# Patient Record
Sex: Female | Born: 1965 | Race: White | Hispanic: No | Smoking: Never smoker
Health system: Southern US, Community
[De-identification: ages and names within clinical notes are randomized; demographics above are authoritative.]

## PROBLEM LIST (undated history)

## (undated) DIAGNOSIS — E785 Hyperlipidemia, unspecified: Secondary | ICD-10-CM

## (undated) DIAGNOSIS — F419 Anxiety disorder, unspecified: Secondary | ICD-10-CM

## (undated) DIAGNOSIS — N926 Irregular menstruation, unspecified: Secondary | ICD-10-CM

## (undated) DIAGNOSIS — E559 Vitamin D deficiency, unspecified: Secondary | ICD-10-CM

## (undated) DIAGNOSIS — M1990 Unspecified osteoarthritis, unspecified site: Secondary | ICD-10-CM

## (undated) DIAGNOSIS — G501 Atypical facial pain: Secondary | ICD-10-CM

## (undated) DIAGNOSIS — R42 Dizziness and giddiness: Secondary | ICD-10-CM

## (undated) DIAGNOSIS — G479 Sleep disorder, unspecified: Secondary | ICD-10-CM

## (undated) DIAGNOSIS — M419 Scoliosis, unspecified: Secondary | ICD-10-CM

## (undated) HISTORY — DX: Sleep disorder, unspecified: G47.9

## (undated) HISTORY — PX: KNEE SURGERY: SHX244

## (undated) HISTORY — DX: Irregular menstruation, unspecified: N92.6

## (undated) HISTORY — DX: Hyperlipidemia, unspecified: E78.5

## (undated) HISTORY — DX: Atypical facial pain: G50.1

## (undated) HISTORY — DX: Vitamin D deficiency, unspecified: E55.9

---

## 1997-12-10 ENCOUNTER — Inpatient Hospital Stay (HOSPITAL_COMMUNITY): Admission: AD | Admit: 1997-12-10 | Discharge: 1997-12-10 | Payer: Self-pay | Admitting: Obstetrics and Gynecology

## 1998-03-15 ENCOUNTER — Inpatient Hospital Stay (HOSPITAL_COMMUNITY): Admission: AD | Admit: 1998-03-15 | Discharge: 1998-03-18 | Payer: Self-pay | Admitting: Obstetrics and Gynecology

## 1998-04-17 ENCOUNTER — Other Ambulatory Visit: Admission: RE | Admit: 1998-04-17 | Discharge: 1998-04-17 | Payer: Self-pay | Admitting: *Deleted

## 1999-05-09 ENCOUNTER — Other Ambulatory Visit: Admission: RE | Admit: 1999-05-09 | Discharge: 1999-05-09 | Payer: Self-pay | Admitting: Obstetrics and Gynecology

## 2000-05-25 ENCOUNTER — Other Ambulatory Visit: Admission: RE | Admit: 2000-05-25 | Discharge: 2000-05-25 | Payer: Self-pay | Admitting: Obstetrics and Gynecology

## 2006-03-31 ENCOUNTER — Ambulatory Visit: Payer: Self-pay | Admitting: Internal Medicine

## 2007-11-12 ENCOUNTER — Ambulatory Visit: Payer: Self-pay | Admitting: Internal Medicine

## 2008-03-01 ENCOUNTER — Ambulatory Visit: Payer: Self-pay

## 2009-01-31 ENCOUNTER — Ambulatory Visit: Payer: Self-pay | Admitting: Surgery

## 2009-02-05 ENCOUNTER — Ambulatory Visit: Payer: Self-pay | Admitting: Surgery

## 2009-03-09 ENCOUNTER — Ambulatory Visit: Payer: Self-pay

## 2010-04-09 ENCOUNTER — Ambulatory Visit: Payer: Self-pay | Admitting: Unknown Physician Specialty

## 2010-08-28 ENCOUNTER — Ambulatory Visit: Payer: Self-pay | Admitting: Obstetrics and Gynecology

## 2011-05-14 ENCOUNTER — Ambulatory Visit: Payer: Self-pay

## 2011-12-17 ENCOUNTER — Ambulatory Visit (INDEPENDENT_AMBULATORY_CARE_PROVIDER_SITE_OTHER): Payer: BC Managed Care – PPO | Admitting: Sports Medicine

## 2011-12-17 VITALS — BP 120/70 | Ht 64.0 in | Wt 180.0 lb

## 2011-12-17 DIAGNOSIS — M25569 Pain in unspecified knee: Secondary | ICD-10-CM

## 2011-12-17 DIAGNOSIS — M25562 Pain in left knee: Secondary | ICD-10-CM

## 2011-12-17 NOTE — Assessment & Plan Note (Addendum)
Pt most likely having knee pain r/t deconditioning of muscles- which has been worsened by pt's fear of using left knee.  Pt given quad/knee strengthening exercises as well as balance exercises. Pt also to wear body helix knee brace for support.  See pt handout.  Pt to return in 6 weeks for f/up.

## 2011-12-17 NOTE — Patient Instructions (Addendum)
Do exercises on Handout that are circled. Do stair step- start with 4 inches and move up to 8 inch step.  Do front stepup, lateral step, crossover stepup Balance exercises- cone touch-- start high with touching table then work towards touching small cone-- do 5 then work up to 10x  Single leg balance- stand on 1 leg and close eyes- work up to 10 x  And holding for 10 seconds  Return in 6 weeks for recheck.

## 2011-12-17 NOTE — Progress Notes (Signed)
  Subjective:    Patient ID: Angela Wu, female    DOB: 1966/03/19, 46 y.o.   MRN: [id number]  HPI Left knee pain: Pt here for initial evaluation for left knee pain.  Pt had left patellar fracture in 2011- was surgically corrected and immobilized x 14 weeks- had long duration of PT with significant post op discomfort.  Since that time has continued to have pain in left knee that has caused her to be unable to exercise without pain, uncomfortable and scared to walk up and down steps afraid that her left knee will give out of her.  Left knee will feel unsteady or occasionally just "give out"  if stepping off curb, on uneven surface, or on slick surface.  Unable to wear any shoe with a heel 2/2 to knee discomfort.  Here today for evaluation and treatment for pain and would like to approve functionality of left knee so that she can do exercise, use stairs, walk on uneven surface without problem.  No knee swelling. No knee redness. No fever.    Review of Systems As per above.     Objective:   Physical Exam  Constitutional: She appears well-developed.  Cardiovascular: Normal rate.   Pulmonary/Chest: Effort normal. No respiratory distress.  Musculoskeletal:       Left knee: Midline scar present over knee.  No knee redness. No knee effusion.  Decreased sensation around scar otherwise normal.  + Full flexion unable to fully extend left knee 2/2 pain and tightness in left knee- lacks 10 degrees in extension. Passively able to extend completely.  Ligaments of knee with normal end points.  Some + crepitus in left knee with movement             Assessment & Plan:

## 2012-01-28 ENCOUNTER — Ambulatory Visit (INDEPENDENT_AMBULATORY_CARE_PROVIDER_SITE_OTHER): Payer: BC Managed Care – PPO | Admitting: Sports Medicine

## 2012-01-28 VITALS — BP 100/67

## 2012-01-28 DIAGNOSIS — M25562 Pain in left knee: Secondary | ICD-10-CM

## 2012-01-28 DIAGNOSIS — M25569 Pain in unspecified knee: Secondary | ICD-10-CM

## 2012-01-28 NOTE — Progress Notes (Signed)
  Subjective:    Patient ID: Angela Wu, female    DOB: [birth year], 46 y.o.   MRN: [id number]  HPI Pt that comes today for f/u left knee pain  Hx: Left patellar fracture in 2011 s/p surgically corrected and immobilized for 14 weeks and post op PT.  The pain in left knee has improved 40% since last visit with knee exercises, which she reports compliance, and occasional Ibuprofen. She is afraid to walk up and down steps because pain and/or fear that left knee may give out. Squatting and sitting on the floor are the most painful postures she recalls. No knee redness. No fever.   Review of Systems per HPI   Objective:   Physical Exam Musculoskeletal:  Left knee:Midline scar over knee.No knee effusion or erythema. Normal ROM active and passive. ( Able to completely flex and extend knee). Positive crepitus over knee cap with flexion/extension. No joint laxity. Moderated tenderness over lateral side of joint line.   Stable ligaments Some weakness of hip abduction Atrophy of the VMO     Assessment & Plan:

## 2012-01-28 NOTE — Assessment & Plan Note (Signed)
She has made a lot of progress in the motion of her knee  We will keep up rehabilitation exercises to strengthen the hip and quadriceps  Continue to work on full extension  Used a compression sleeve  Recheck with [place] in 2-3 months

## 2012-03-24 ENCOUNTER — Ambulatory Visit (INDEPENDENT_AMBULATORY_CARE_PROVIDER_SITE_OTHER): Payer: BC Managed Care – PPO | Admitting: Sports Medicine

## 2012-03-24 VITALS — BP 121/80 | Ht 64.0 in | Wt 170.0 lb

## 2012-03-24 DIAGNOSIS — M25569 Pain in unspecified knee: Secondary | ICD-10-CM

## 2012-03-24 DIAGNOSIS — M25562 Pain in left knee: Secondary | ICD-10-CM

## 2012-03-24 NOTE — Patient Instructions (Signed)
Good to see you. You are doing great! Keep going to the gym and work on full extension of the knee.  Work on hip strength and core strength.  Try the sitting elliptical.  Come back as needed.

## 2012-03-24 NOTE — Progress Notes (Signed)
  Subjective:    Patient ID: Angela Wu, female    DOB: 08-Mar-1966, 46 y.o.   MRN: [id number]  HPI Pt that comes today for f/u left knee pain  Patient states the pain has improved again. Patient has been doing the rehabilitation exercises as well as wearing the compression sleeve as needed. Patient is focused on trying to get full extension of the knee now. Patient also states she's been trying to strengthen the hip and quadricep. Patient has joined a gym which has been very beneficial in this started doing the exercises with weights. Patient has also lost weight which she states has helped. Patient has not been wearing the knee compression sleeve as much, and only uses ibuprofen once or twice a week.  Hx: Left patellar fracture in 2011 s/p surgically corrected and immobilized for 14 weeks and post op PT.    Review of Systems per HPI   Objective:   Physical Exam Filed Vitals:   03/24/12 0841  BP: 121/80   general: No apparent distress alert and oriented x3  Musculoskeletal:  Left knee:Midline scar over knee.No knee effusion or erythema. Normal ROM active and passive. ( Able to completely flex and extend knee). Minimal crepitus over knee cap with flexion/extension. No joint laxity.  Stable ligaments Some weakness of hip abduction patient has improved strength from previous exam now for 4/5. Atrophy of the VMO is still present but has improved    Assessment & Plan:

## 2012-03-24 NOTE — Assessment & Plan Note (Signed)
Patient has improved significantly pronounced 2 years status post patellar fracture ORIF. Patient seems to be having full range of motion in pain is minimal. Encourage patient to continue strengthening focusing mostly on the quad as well as the hip abductor strength. Patient encouraged to continue with weight loss as well which will be beneficial. Patient can followup as needed at this time. If she has any setbacks or any worsening of pain would be very happy to help.

## 2013-05-06 ENCOUNTER — Ambulatory Visit: Payer: Self-pay | Admitting: Obstetrics and Gynecology

## 2014-06-19 ENCOUNTER — Ambulatory Visit: Payer: Self-pay | Admitting: Obstetrics and Gynecology

## 2014-08-07 ENCOUNTER — Emergency Department: Payer: Self-pay | Admitting: Emergency Medicine

## 2014-08-11 ENCOUNTER — Emergency Department: Payer: Self-pay | Admitting: Emergency Medicine

## 2014-11-24 ENCOUNTER — Ambulatory Visit: Payer: No Typology Code available for payment source | Attending: Otolaryngology

## 2014-11-24 DIAGNOSIS — R0683 Snoring: Secondary | ICD-10-CM | POA: Diagnosis not present

## 2014-11-24 DIAGNOSIS — G4710 Hypersomnia, unspecified: Secondary | ICD-10-CM | POA: Diagnosis present

## 2014-12-28 ENCOUNTER — Telehealth: Payer: Self-pay | Admitting: Obstetrics and Gynecology

## 2014-12-28 DIAGNOSIS — Z3040 Encounter for surveillance of contraceptives, unspecified: Secondary | ICD-10-CM

## 2014-12-28 DIAGNOSIS — F339 Major depressive disorder, recurrent, unspecified: Secondary | ICD-10-CM

## 2014-12-28 MED ORDER — NORETHIN ACE-ETH ESTRAD-FE 1.5-30 MG-MCG PO TABS: 1.0000 | ORAL_TABLET | Freq: Every day | ORAL | Status: DC

## 2014-12-28 MED ORDER — SERTRALINE HCL 25 MG PO TABS: 50.0000 mg | ORAL_TABLET | Freq: Every day | ORAL | Status: DC

## 2014-12-28 NOTE — Telephone Encounter (Signed)
WE RESCHEDULED HER AE PER DENISE TO 7/26. SHE WILL NEED HER BC REFILLED AND ZOLOFT. SHE SAID AT LEAST 1 MONTH UNTIL SHE IS SEEN. [place] U

## 2014-12-28 NOTE — Telephone Encounter (Signed)
Refilled pts zoloft and junel fe x 1 until annual appt, was rescheduled due to new computer system

## 2015-01-25 ENCOUNTER — Encounter: Payer: Self-pay | Admitting: *Deleted

## 2015-01-30 ENCOUNTER — Encounter: Payer: Self-pay | Admitting: Obstetrics and Gynecology

## 2015-01-30 ENCOUNTER — Ambulatory Visit (INDEPENDENT_AMBULATORY_CARE_PROVIDER_SITE_OTHER): Payer: 59 | Admitting: Obstetrics and Gynecology

## 2015-01-30 VITALS — BP 112/82 | HR 86 | Ht 64.5 in | Wt 203.2 lb

## 2015-01-30 DIAGNOSIS — Z01419 Encounter for gynecological examination (general) (routine) without abnormal findings: Secondary | ICD-10-CM

## 2015-01-30 NOTE — Progress Notes (Signed)
  Subjective:     Angela Wu is a 49 y.o. female and is here for a comprehensive physical exam. The patient reports no problems.  History   Social History  . Marital Status: Married    Spouse Name: N/A  . Number of Children: N/A  . Years of Education: N/A   Occupational History  . Not on file.   Social History Main Topics  . Smoking status: Never Smoker   . Smokeless tobacco: Never Used  . Alcohol Use: Yes  . Drug Use: No  . Sexual Activity: No   Other Topics Concern  . Not on file   Social History Narrative   Health Maintenance  Topic Date Due  . HIV Screening  12/20/1982  . PAP SMEAR  12/19/1985  . TETANUS/TDAP  12/20/1986  . INFLUENZA VACCINE  02/05/2015    The following portions of the patient's history were reviewed and updated as appropriate: allergies, current medications, past family history, past medical history, past social history, past surgical history and problem list.  Review of Systems A comprehensive review of systems was negative.   Objective:    General appearance: alert, cooperative, appears stated age and mildly obese Throat: lips, mucosa, and tongue normal; teeth and gums normal Lungs: clear to auscultation bilaterally Breasts: normal appearance, no masses or tenderness Heart: regular rate and rhythm, S1, S2 normal, no murmur, click, rub or gallop Abdomen: soft, non-tender; bowel sounds normal; no masses,  no organomegaly Pelvic: cervix normal in appearance, external genitalia normal, no adnexal masses or tenderness, no cervical motion tenderness, rectovaginal septum normal, uterus normal size, shape, and consistency and vagina normal without discharge    Assessment:    Healthy female exam. OCP user for cycle control- occasional BTB; Obesity      Plan:  Pap not indicated Routine screening MMG done  OCPs refilled RTC 1 year or PRN See After Visit Summary for Counseling Recommendations   Melody Trudee Kuster, CNM

## 2015-01-30 NOTE — Patient Instructions (Signed)
Thank you for enrolling in [facility]. Please follow the instructions below to securely access your online medical record. MyChart allows you to send messages to your doctor, view your test results, renew your prescriptions, schedule appointments, and more.  How Do I Sign Up? 1. In your Internet browser, go to [email or web address]. 2. Click on the New  User? link in the Sign In box.  3. Enter your MyChart Access Code exactly as it appears below. You will not need to use this code after you have completed the sign-up process. If you do not sign up before the expiration date, you must request a new code. MyChart Access Code: [email or web address] Expires: 03/31/2015  2:04 PM  4. Enter the last four digits of your Social Security Number (xxxx) and Date of Birth (mm/dd/yyyy) as indicated and click Next. You will be taken to the next sign-up page. 5. Create a MyChart ID. This will be your MyChart login ID and cannot be changed, so think of one that is secure and easy to remember. 6. Create a MyChart password. You can change your password at any time. 7. Enter your Password Reset Question and Answer and click Next. This can be used at a later time if you forget your password.  8. Select your communication preference, and if applicable enter your e-mail address. You will receive e-mail notification when new information is available in MyChart by choosing to receive e-mail notifications and filling in your e-mail. 9. Click Sign In. You can now view your medical record.   Additional Information If you have questions, you can email [email or web address] or call [phone] to talk to our [place] staff. Remember, MyChart is NOT to be used for urgent needs. For medical emergencies, dial 911.

## 2015-02-07 ENCOUNTER — Telehealth: Payer: Self-pay | Admitting: Obstetrics and Gynecology

## 2015-02-07 NOTE — Telephone Encounter (Signed)
Refill not at [facility] / bladder issues, can't all urine out only at night and she can't sleep.

## 2015-02-08 ENCOUNTER — Other Ambulatory Visit: Payer: Self-pay | Admitting: Obstetrics and Gynecology

## 2015-02-08 NOTE — Telephone Encounter (Signed)
Hey Mel do you know anything about this???

## 2015-02-13 NOTE — Telephone Encounter (Signed)
Please call to clarify.

## 2015-02-19 NOTE — Telephone Encounter (Signed)
i have tried to contact pt several times na MNB has tried to call her will send letter to contact if needs [place]

## 2015-05-18 ENCOUNTER — Telehealth: Payer: Self-pay | Admitting: Obstetrics and Gynecology

## 2015-05-18 NOTE — Telephone Encounter (Signed)
Patient called requesting a refill on sertraline(Zoloft) sent to the [facility]. She is almost out. Thanks

## 2015-05-18 NOTE — Telephone Encounter (Signed)
Done-ac 

## 2015-08-07 ENCOUNTER — Telehealth: Payer: Self-pay | Admitting: Obstetrics and Gynecology

## 2015-08-07 NOTE — Telephone Encounter (Signed)
Called pt she is having some sx where she is getting up and urinating about 5 x a night,advised pt she needs appt with [facility]- she voiced understanding

## 2015-08-07 NOTE — Telephone Encounter (Signed)
Pt left msg on answer machine that she had an issue and wanted to speak to a nurse

## 2015-08-16 ENCOUNTER — Ambulatory Visit: Payer: Self-pay | Admitting: Obstetrics and Gynecology

## 2015-09-05 ENCOUNTER — Ambulatory Visit: Payer: Self-pay | Admitting: Obstetrics and Gynecology

## 2016-01-06 ENCOUNTER — Emergency Department: Payer: BLUE CROSS/BLUE SHIELD

## 2016-01-06 ENCOUNTER — Emergency Department
Admission: EM | Admit: 2016-01-06 | Discharge: 2016-01-06 | Disposition: A | Discharge status: Home / Self Care | Payer: BLUE CROSS/BLUE SHIELD | Attending: Emergency Medicine | Admitting: Emergency Medicine

## 2016-01-06 ENCOUNTER — Encounter: Payer: Self-pay | Admitting: Emergency Medicine

## 2016-01-06 DIAGNOSIS — Y9289 Other specified places as the place of occurrence of the external cause: Secondary | ICD-10-CM | POA: Diagnosis not present

## 2016-01-06 DIAGNOSIS — E785 Hyperlipidemia, unspecified: Secondary | ICD-10-CM | POA: Diagnosis not present

## 2016-01-06 DIAGNOSIS — W172XXA Fall into hole, initial encounter: Secondary | ICD-10-CM | POA: Diagnosis not present

## 2016-01-06 DIAGNOSIS — S8392XA Sprain of unspecified site of left knee, initial encounter: Secondary | ICD-10-CM

## 2016-01-06 DIAGNOSIS — S93401A Sprain of unspecified ligament of right ankle, initial encounter: Secondary | ICD-10-CM | POA: Insufficient documentation

## 2016-01-06 DIAGNOSIS — Y939 Activity, unspecified: Secondary | ICD-10-CM | POA: Insufficient documentation

## 2016-01-06 DIAGNOSIS — Y999 Unspecified external cause status: Secondary | ICD-10-CM | POA: Insufficient documentation

## 2016-01-06 DIAGNOSIS — Z79899 Other long term (current) drug therapy: Secondary | ICD-10-CM | POA: Insufficient documentation

## 2016-01-06 DIAGNOSIS — G8911 Acute pain due to trauma: Secondary | ICD-10-CM

## 2016-01-06 DIAGNOSIS — M25571 Pain in right ankle and joints of right foot: Secondary | ICD-10-CM | POA: Diagnosis present

## 2016-01-06 HISTORY — DX: Anxiety disorder, unspecified: F41.9

## 2016-01-06 MED ORDER — OXYCODONE HCL 5 MG PO TABS
5.0000 mg | ORAL_TABLET | Freq: Once | ORAL | Status: AC
  Administered 2016-01-06: 5 mg via ORAL
  Filled 2016-01-06: qty 1

## 2016-01-06 MED ORDER — IBUPROFEN 800 MG PO TABS: 800.0000 mg | ORAL_TABLET | Freq: Three times a day (TID) | ORAL | Status: DC | PRN

## 2016-01-06 MED ORDER — OXYCODONE-ACETAMINOPHEN 5-325 MG PO TABS: 1.0000 | ORAL_TABLET | Freq: Four times a day (QID) | ORAL | Status: DC | PRN

## 2016-01-06 NOTE — ED Notes (Signed)
[place] c/o pain to bilateral lower extremities; no bruising noted; mild swelling to right foot;

## 2016-01-06 NOTE — ED Provider Notes (Signed)
[facility] Emergency Department Provider Note ____________________________________________  Time seen: Approximately 9:42 PM  I have reviewed the triage vital signs and the nursing notes.   HISTORY  Chief Complaint Fall    HPI Angela Wu is a 50 y.o. female who presents to the emergency department for evaluation of bilateral lower extremity pain after falling at approximately 5:19PM. She states that she stepped in a hole with her right foot, fell on her left knee, and then when trying to stand back up twisted her left ankle and fell onto the right knee.She took 800 mg of ibuprofen prior to arrival.  Past Medical History  Diagnosis Date  . Hyperlipidemia   . Irregular bleeding   . Sleep disturbance   . Vitamin D deficiency   . Anxiety     Patient Active Problem List   Diagnosis Date Noted  . Left knee pain 12/17/2011    Past Surgical History  Procedure Laterality Date  . Cesarean section      Current Outpatient Rx  Name  Route  Sig  Dispense  Refill  . ibuprofen (ADVIL,MOTRIN) 800 MG tablet   Oral   Take 1 tablet (800 mg total) by mouth every 8 (eight) hours as needed.   30 tablet   0   . norethindrone-ethinyl estradiol-iron (MICROGESTIN FE 1.5/30) 1.5-30 MG-MCG tablet   Oral   Take 1 tablet by mouth daily.   1 Package   1   . oxyCODONE-acetaminophen (ROXICET) 5-325 MG tablet   Oral   Take 1 tablet by mouth every 6 (six) hours as needed.   12 tablet   0   . sertraline (ZOLOFT) 25 MG tablet   Oral   Take 2 tablets (50 mg total) by mouth daily.   60 tablet   1     Allergies Review of patient's allergies indicates no known allergies.  Family History  Problem Relation Age of Onset  . Heart disease Father     Social History Social History  Substance Use Topics  . Smoking status: Never Smoker   . Smokeless tobacco: Never Used  . Alcohol Use: Yes    Review of Systems Constitutional: No recent illness. Cardiovascular:  Denies chest pain or palpitations. Respiratory: Denies shortness of breath. Musculoskeletal: Pain in Bilateral lower extremities. Skin: Negative for rash, wound, lesion. Neurological: Negative for focal weakness or numbness.  ____________________________________________   PHYSICAL EXAM:  VITAL SIGNS: ED Triage Vitals  Enc Vitals Group     BP 01/06/16 1907 120/79 mmHg     Pulse Rate 01/06/16 1907 89     Resp 01/06/16 1907 18     Temp 01/06/16 1907 97.6 F (36.4 C)     Temp Source 01/06/16 1907 Oral     SpO2 01/06/16 1907 100 %     Weight 01/06/16 1907 175 lb (79.379 kg)     Height 01/06/16 1907 5\' 5"  (1.651 m)     Head Cir --      Peak Flow --      Pain Score 01/06/16 1908 8     Pain Loc --      Pain Edu? --      Excl. in [place]? --     Constitutional: Alert and oriented. Well appearing and in no acute distress. Eyes: Conjunctivae are normal. EOMI. Head: Atraumatic. Neck: No stridor.  Respiratory: Normal respiratory effort.   Musculoskeletal: Left knee mildly edematous without lesion or wound. Left ankle mildly edematous in the ATFL pattern. Tenderness over  the right distal femur/proximal right patella without edema or deformity. Tenderness over the length of the tibia and fibula of the right lower extremity extending down to the ankle. There is no deformity of the ankle, there is mild edema in the ATFL pattern as well as tenderness in the upper portion of the foot. Neurologic:  Normal speech and language. No gross focal neurologic deficits are appreciated. Speech is normal. No gait instability. Skin:  Skin is warm, dry and intact. Atraumatic. Psychiatric: Mood and affect are normal. Speech and behavior are normal.  ____________________________________________   LABS (all labs ordered are listed, but only abnormal results are displayed)  Labs Reviewed - No data to display ____________________________________________  RADIOLOGY  Left knee, left ankle, right femur, and  right tib-fib all negative for acute bony abnormality per radiology. ____________________________________________   PROCEDURES  Procedure(s) performed:  Ace bandage was applied to the left knee by ER tech. Velcro ankle stirrup splint was applied to the right ankle by ER tech. Patient was neurovascularly intact post-splint application.   ____________________________________________   INITIAL IMPRESSION / ASSESSMENT AND PLAN / ED COURSE  Pertinent labs & imaging results that were available during my care of the patient were reviewed by me and considered in my medical decision making (see chart for details).  Patient was instructed to schedule a follow-up appointment with Reche Dixon, PA-C who is her orthopedist. She was advised to rest, ice, and elevate the lower extremities as much as possible over the next few days. She is advised to return to the emergency department for symptoms that change or worsen if she's unable to schedule an appointment. She will be prescribed Roxicet 5-325, 12 tablets and ibuprofen 800 mg #30. ____________________________________________   FINAL CLINICAL IMPRESSION(S) / ED DIAGNOSES  Final diagnoses:  Acute pain due to injury  Left knee sprain, initial encounter  Right ankle sprain, initial encounter       Victorino Dike, FNP 01/06/16 2228  Delman Kitten, MD 01/06/16 2354

## 2016-01-31 ENCOUNTER — Encounter: Payer: 59 | Admitting: Obstetrics and Gynecology

## 2016-02-04 ENCOUNTER — Encounter: Payer: Self-pay | Admitting: Obstetrics and Gynecology

## 2016-02-28 ENCOUNTER — Telehealth: Payer: Self-pay | Admitting: Obstetrics and Gynecology

## 2016-02-28 NOTE — Telephone Encounter (Signed)
WE HAD TO CX PTS AE ON 03/13/16 DUE TO [facility] ON VACATION, SHE IS RESCHEDULED TO 11/7 FOR AE NOW, THAT IS WHEN THE AE SLOT WAS FOR [facility] SCHEDULE, SHE IS GOING TO NEED REFILLS ON HER ZOLOFT AND BC, SHE USES [facility].

## 2016-03-03 ENCOUNTER — Other Ambulatory Visit: Payer: Self-pay | Admitting: *Deleted

## 2016-03-03 DIAGNOSIS — F339 Major depressive disorder, recurrent, unspecified: Secondary | ICD-10-CM

## 2016-03-03 DIAGNOSIS — Z3040 Encounter for surveillance of contraceptives, unspecified: Secondary | ICD-10-CM

## 2016-03-03 MED ORDER — SERTRALINE HCL 25 MG PO TABS: 50.0000 mg | ORAL_TABLET | Freq: Every day | ORAL | 3 refills | Status: DC

## 2016-03-03 MED ORDER — NORETHIN ACE-ETH ESTRAD-FE 1.5-30 MG-MCG PO TABS: 1.0000 | ORAL_TABLET | Freq: Every day | ORAL | 3 refills | Status: DC

## 2016-03-03 NOTE — Telephone Encounter (Signed)
rx refilled for pt

## 2016-03-13 ENCOUNTER — Encounter: Payer: Self-pay | Admitting: Obstetrics and Gynecology

## 2016-05-13 ENCOUNTER — Encounter: Payer: Self-pay | Admitting: Obstetrics and Gynecology

## 2016-05-13 ENCOUNTER — Other Ambulatory Visit: Payer: Self-pay | Admitting: Obstetrics and Gynecology

## 2016-05-13 ENCOUNTER — Ambulatory Visit (INDEPENDENT_AMBULATORY_CARE_PROVIDER_SITE_OTHER): Payer: BLUE CROSS/BLUE SHIELD | Admitting: Obstetrics and Gynecology

## 2016-05-13 VITALS — BP 124/80 | HR 87 | Ht 64.5 in | Wt 194.7 lb

## 2016-05-13 DIAGNOSIS — Z01419 Encounter for gynecological examination (general) (routine) without abnormal findings: Secondary | ICD-10-CM

## 2016-05-13 DIAGNOSIS — F339 Major depressive disorder, recurrent, unspecified: Secondary | ICD-10-CM

## 2016-05-13 DIAGNOSIS — Z3041 Encounter for surveillance of contraceptive pills: Secondary | ICD-10-CM

## 2016-05-13 MED ORDER — SERTRALINE HCL 25 MG PO TABS: 50.0000 mg | ORAL_TABLET | Freq: Every day | ORAL | 3 refills | Status: DC

## 2016-05-13 MED ORDER — NORETHIN ACE-ETH ESTRAD-FE 1.5-30 MG-MCG PO TABS: 1.0000 | ORAL_TABLET | Freq: Every day | ORAL | 3 refills | Status: DC

## 2016-05-13 NOTE — Progress Notes (Signed)
Subjective:   Angela Wu is a 50 y.o. G36P0 Caucasian female here for a routine well-woman exam.  Patient's last menstrual period was 05/05/2016.    Current complaints: Feels that she has to empty her bladder completely before she can fall asleep. Also reports drinking 2 32oz cups of water prior to bedtime. OCP user, states periods are light and only has breakthrough bleeding when she takes her pill later. PCP: None       Does desire labs Does not want flu shot. TDAP UTD per patient Mother diagnosed in February 2017 with ovarian cancer and had total hysterectomy in April 2017.   Social History: Sexual: heterosexual Marital Status: divorced Living situation: with family Occupation: Works at her own [employer]. Tobacco/alcohol: very infrequent ETOH use, caffeine use occasional Illicit drugs: no history of illicit drug use  The following portions of the patient's history were reviewed and updated as appropriate: allergies, current medications, past family history, past medical history, past social history, past surgical history and problem list.  Past Medical History Past Medical History:  Diagnosis Date  . Anxiety   . Hyperlipidemia   . Irregular bleeding   . Sleep disturbance   . Vitamin D deficiency     Past Surgical History Past Surgical History:  Procedure Laterality Date  . CESAREAN SECTION      Gynecologic History G2P0  Patient's last menstrual period was 05/05/2016. Contraception: OCP (estrogen/progesterone) Last Pap: 2015. Results were: normal Last mammogram: 2016. Results were: normal   Obstetric History OB History  Gravida Para Term Preterm AB Living  2            SAB TAB Ectopic Multiple Live Births               # Outcome Date GA Lbr Len/2nd Weight Sex Delivery Anes PTL Lv  2 Gravida 2002    M VBAC     1 Gravida 1999    F CS-Unspec         Current Medications Current Outpatient Prescriptions on File Prior to Visit  Medication Sig Dispense  Refill  . ibuprofen (ADVIL,MOTRIN) 800 MG tablet Take 1 tablet (800 mg total) by mouth every 8 (eight) hours as needed. 30 tablet 0  . norethindrone-ethinyl estradiol-iron (MICROGESTIN FE 1.5/30) 1.5-30 MG-MCG tablet Take 1 tablet by mouth daily. 1 Package 3  . oxyCODONE-acetaminophen (ROXICET) 5-325 MG tablet Take 1 tablet by mouth every 6 (six) hours as needed. 12 tablet 0  . sertraline (ZOLOFT) 25 MG tablet Take 2 tablets (50 mg total) by mouth daily. 60 tablet 3   No current facility-administered medications on file prior to visit.     Review of Systems Patient denies any headaches, blurred vision, shortness of breath, chest pain, abdominal pain, problems with bowel movements, urination, or intercourse.   Objective:  BP 124/80   Pulse 87   Ht 5' 4.5" (1.638 m)   Wt 194 lb 11.2 oz (88.3 kg)   LMP 05/05/2016   BMI 32.90 kg/m  Physical Exam  General:  Well developed, well nourished, no acute distress. She is alert and oriented x3. Skin:  Warm and dry Neck:  Midline trachea, no thyromegaly or nodules Cardiovascular: Regular rate and rhythm, no murmur heard Lungs:  Effort normal, all lung fields clear to auscultation bilaterally Breasts:  No dominant palpable mass, retraction, or nipple discharge Abdomen:  Soft, non tender, no hepatosplenomegaly or masses Pelvic:  External genitalia is normal in appearance.  The vagina is normal in appearance.  The cervix is bulbous, no CMT.  Thin prep pap is done with HR HPV cotesting. Uterus is felt to be normal size, shape, and contour.  No adnexal masses or tenderness noted. Rectal: Good sphincter tone, no polyps, or hemorrhoids felt.   Extremities:  No swelling or varicosities noted Psych:  She has a normal mood and affect  Assessment:   Healthy well-woman exam Obesity Cancer risk screening OCP user Urinary frequency at bedtime Depression  Plan:  Will have My Risk done today. Routine labs collected. OCPs refilled Discussed with  patient about not drinking so much water before bedtime so she does not have to go to the bathroom as frequently. Zoloft working well for depression, will continue to take. F/U in 1 year for AE, or sooner if needed Mammogram to be scheduled  Thea Gist, RN, Student FNP Melody Rockney Ghee, CNM

## 2016-05-13 NOTE — Patient Instructions (Signed)
  Place annual gynecologic exam patient instructions here.  Thank you for enrolling in [facility]. Please follow the instructions below to securely access your online medical record. MyChart allows you to send messages to your doctor, view your test results, manage appointments, and more.   How Do I Sign Up? 1. In your Internet browser, go to [employer] and enter [email or web address]. 2. Click on the Sign Up Now link in the Sign In box. You will see the New Member Sign Up page. 3. Enter your MyChart Access Code exactly as it appears below. You will not need to use this code after you've completed the sign-up process. If you do not sign up before the expiration date, you must request a new code.  MyChart Access Code: [email or web address] Expires: 07/12/2016  2:14 PM  4. Enter your Social Security Number ([id number]) and Date of Birth (mm/dd/yyyy) as indicated and click Submit. You will be taken to the next sign-up page. 5. Create a MyChart ID. This will be your MyChart login ID and cannot be changed, so think of one that is secure and easy to remember. 6. Create a MyChart password. You can change your password at any time. 7. Enter your Password Reset Question and Answer. This can be used at a later time if you forget your password.  8. Enter your e-mail address. You will receive e-mail notification when new information is available in [place]. 9. Click Sign Up. You can now view your medical record.   Additional Information Remember, MyChart is NOT to be used for urgent needs. For medical emergencies, dial 911.

## 2016-05-14 LAB — HEMOGLOBIN A1C
ESTIMATED AVERAGE GLUCOSE: 103 mg/dL
Hgb A1c MFr Bld: 5.2 % (ref 4.8–5.6)

## 2016-05-14 LAB — COMPREHENSIVE METABOLIC PANEL
A/G RATIO: 1.5 (ref 1.2–2.2)
ALT: 12 IU/L (ref 0–32)
AST: 13 IU/L (ref 0–40)
Albumin: 4.4 g/dL (ref 3.5–5.5)
Alkaline Phosphatase: 99 IU/L (ref 39–117)
BILIRUBIN TOTAL: 0.4 mg/dL (ref 0.0–1.2)
BUN/Creatinine Ratio: 19 (ref 9–23)
BUN: 11 mg/dL (ref 6–24)
CALCIUM: 9.6 mg/dL (ref 8.7–10.2)
CHLORIDE: 100 mmol/L (ref 96–106)
CO2: 24 mmol/L (ref 18–29)
Creatinine, Ser: 0.58 mg/dL (ref 0.57–1.00)
GFR calc Af Amer: 126 mL/min/{1.73_m2} (ref >59)
GFR, EST NON AFRICAN AMERICAN: 109 mL/min/{1.73_m2} (ref >59)
GLOBULIN, TOTAL: 3 g/dL (ref 1.5–4.5)
Glucose: 97 mg/dL (ref 65–99)
POTASSIUM: 4.4 mmol/L (ref 3.5–5.2)
SODIUM: 140 mmol/L (ref 134–144)
Total Protein: 7.4 g/dL (ref 6.0–8.5)

## 2016-05-14 LAB — LIPID PANEL
CHOLESTEROL TOTAL: 264 mg/dL — AB (ref 100–199)
Chol/HDL Ratio: 6.1 ratio units — ABNORMAL HIGH (ref 0.0–4.4)
HDL: 43 mg/dL (ref >39)
LDL CALC: 152 mg/dL — AB (ref 0–99)
TRIGLYCERIDES: 346 mg/dL — AB (ref 0–149)
VLDL CHOLESTEROL CAL: 69 mg/dL — AB (ref 5–40)

## 2016-05-14 LAB — CBC
HEMATOCRIT: 41.7 % (ref 34.0–46.6)
HEMOGLOBIN: 14.1 g/dL (ref 11.1–15.9)
MCH: 28.8 pg (ref 26.6–33.0)
MCHC: 33.8 g/dL (ref 31.5–35.7)
MCV: 85 fL (ref 79–97)
Platelets: 421 10*3/uL — ABNORMAL HIGH (ref 150–379)
RBC: 4.9 x10E6/uL (ref 3.77–5.28)
RDW: 12.9 % (ref 12.3–15.4)
WBC: 10.7 10*3/uL (ref 3.4–10.8)

## 2016-05-14 LAB — THYROID PANEL WITH TSH
Free Thyroxine Index: 1.4 (ref 1.2–4.9)
T3 Uptake Ratio: 24 % (ref 24–39)
T4, Total: 5.9 ug/dL (ref 4.5–12.0)
TSH: 3.43 u[IU]/mL (ref 0.450–4.500)

## 2016-05-14 LAB — VITAMIN D 25 HYDROXY (VIT D DEFICIENCY, FRACTURES): Vit D, 25-Hydroxy: 26.5 ng/mL — ABNORMAL LOW (ref 30.0–100.0)

## 2016-05-14 LAB — CYTOLOGY - PAP

## 2016-05-16 ENCOUNTER — Other Ambulatory Visit: Payer: Self-pay | Admitting: Obstetrics and Gynecology

## 2016-05-16 ENCOUNTER — Telehealth: Payer: Self-pay | Admitting: *Deleted

## 2016-05-16 DIAGNOSIS — E785 Hyperlipidemia, unspecified: Secondary | ICD-10-CM

## 2016-05-16 DIAGNOSIS — E559 Vitamin D deficiency, unspecified: Secondary | ICD-10-CM | POA: Insufficient documentation

## 2016-05-16 MED ORDER — ATORVASTATIN CALCIUM 10 MG PO TABS: 10.0000 mg | ORAL_TABLET | Freq: Every day | ORAL | 3 refills | Status: DC

## 2016-05-16 MED ORDER — VITAMIN D (ERGOCALCIFEROL) 1.25 MG (50000 UNIT) PO CAPS: 50000.0000 [IU] | ORAL_CAPSULE | ORAL | 1 refills | Status: DC

## 2016-05-16 NOTE — Telephone Encounter (Signed)
Mailed pt all info on labs

## 2016-05-16 NOTE — Telephone Encounter (Signed)
-----   Message from Joylene Igo, [place] sent at 05/16/2016  9:57 AM EST ----- Please notify the patient that her Vitamin D was low and her Cholesterol and Platelets were high. I will send in a prescription for Vitamin D for her to take twice weekly and would like for her to do this through the winter. I will also send her in a prescription for Lipitor to take daily for her cholesterol. Will order for these abnormal labs to be repeated in 3 months. She will need to be fasting for these labs.  Please mail info on high chol and vit d.  Please remind her to sign up on Mychart

## 2016-10-13 ENCOUNTER — Other Ambulatory Visit: Payer: Self-pay | Admitting: *Deleted

## 2016-10-13 MED ORDER — ATORVASTATIN CALCIUM 10 MG PO TABS: 10.0000 mg | ORAL_TABLET | Freq: Every day | ORAL | 6 refills | Status: DC

## 2017-01-31 ENCOUNTER — Other Ambulatory Visit: Payer: Self-pay | Admitting: Obstetrics and Gynecology

## 2017-01-31 DIAGNOSIS — Z3040 Encounter for surveillance of contraceptives, unspecified: Secondary | ICD-10-CM

## 2017-02-03 ENCOUNTER — Other Ambulatory Visit: Payer: Self-pay | Admitting: Obstetrics and Gynecology

## 2017-02-03 DIAGNOSIS — F339 Major depressive disorder, recurrent, unspecified: Secondary | ICD-10-CM

## 2017-03-11 ENCOUNTER — Other Ambulatory Visit: Payer: Self-pay | Admitting: Obstetrics and Gynecology

## 2017-04-29 ENCOUNTER — Encounter: Payer: Self-pay | Admitting: *Deleted

## 2017-04-29 ENCOUNTER — Emergency Department: Payer: BLUE CROSS/BLUE SHIELD

## 2017-04-29 ENCOUNTER — Emergency Department
Admission: EM | Admit: 2017-04-29 | Discharge: 2017-04-29 | Disposition: A | Discharge status: Home / Self Care | Payer: BLUE CROSS/BLUE SHIELD | Attending: Emergency Medicine | Admitting: Emergency Medicine

## 2017-04-29 DIAGNOSIS — Z79899 Other long term (current) drug therapy: Secondary | ICD-10-CM | POA: Insufficient documentation

## 2017-04-29 DIAGNOSIS — M79601 Pain in right arm: Secondary | ICD-10-CM | POA: Insufficient documentation

## 2017-04-29 DIAGNOSIS — R1084 Generalized abdominal pain: Secondary | ICD-10-CM | POA: Diagnosis not present

## 2017-04-29 DIAGNOSIS — R197 Diarrhea, unspecified: Secondary | ICD-10-CM

## 2017-04-29 DIAGNOSIS — R42 Dizziness and giddiness: Secondary | ICD-10-CM | POA: Insufficient documentation

## 2017-04-29 DIAGNOSIS — M5412 Radiculopathy, cervical region: Secondary | ICD-10-CM | POA: Diagnosis not present

## 2017-04-29 DIAGNOSIS — R109 Unspecified abdominal pain: Secondary | ICD-10-CM

## 2017-04-29 LAB — URINALYSIS, COMPLETE (UACMP) WITH MICROSCOPIC
Bilirubin Urine: NEGATIVE
Glucose, UA: NEGATIVE mg/dL
KETONES UR: NEGATIVE mg/dL
Nitrite: NEGATIVE
PROTEIN: NEGATIVE mg/dL
SPECIFIC GRAVITY, URINE: 1.005 (ref 1.005–1.030)
pH: 7 (ref 5.0–8.0)

## 2017-04-29 LAB — CBC
HEMATOCRIT: 42.3 % (ref 35.0–47.0)
Hemoglobin: 14.2 g/dL (ref 12.0–16.0)
MCH: 28.9 pg (ref 26.0–34.0)
MCHC: 33.5 g/dL (ref 32.0–36.0)
MCV: 86.2 fL (ref 80.0–100.0)
PLATELETS: 409 10*3/uL (ref 150–440)
RBC: 4.91 MIL/uL (ref 3.80–5.20)
RDW: 12.9 % (ref 11.5–14.5)
WBC: 8.6 10*3/uL (ref 3.6–11.0)

## 2017-04-29 LAB — TROPONIN I

## 2017-04-29 LAB — BASIC METABOLIC PANEL
Anion gap: 10 (ref 5–15)
BUN: 8 mg/dL (ref 6–20)
CHLORIDE: 101 mmol/L (ref 101–111)
CO2: 26 mmol/L (ref 22–32)
CREATININE: 0.64 mg/dL (ref 0.44–1.00)
Calcium: 9.5 mg/dL (ref 8.9–10.3)
GFR calc Af Amer: >60 mL/min (ref >60)
GFR calc non Af Amer: >60 mL/min (ref >60)
Glucose, Bld: 91 mg/dL (ref 65–99)
POTASSIUM: 3.9 mmol/L (ref 3.5–5.1)
Sodium: 137 mmol/L (ref 135–145)

## 2017-04-29 MED ORDER — KETOROLAC TROMETHAMINE 30 MG/ML IJ SOLN
15.0000 mg | Freq: Once | INTRAMUSCULAR | Status: AC
  Administered 2017-04-29: 15 mg via INTRAVENOUS
  Filled 2017-04-29: qty 1

## 2017-04-29 MED ORDER — ONDANSETRON HCL 4 MG/2ML IJ SOLN
4.0000 mg | Freq: Once | INTRAMUSCULAR | Status: AC
  Administered 2017-04-29: 4 mg via INTRAVENOUS
  Filled 2017-04-29: qty 2

## 2017-04-29 MED ORDER — SODIUM CHLORIDE 0.9 % IV BOLUS (SEPSIS)
1000.0000 mL | Freq: Once | INTRAVENOUS | Status: AC
  Administered 2017-04-29: 1000 mL via INTRAVENOUS

## 2017-04-29 NOTE — Discharge Instructions (Signed)

## 2017-04-29 NOTE — ED Provider Notes (Signed)
[facility] Emergency Department Provider Note  ____________________________________________  Time seen: Approximately 8:19 PM  I have reviewed the triage vital signs and the nursing notes.   HISTORY  Chief Complaint Dizziness; Arm Pain; and Diarrhea   HPI Angela Wu is a 51 y.o. female with a history of hyperlipidemia and anxiety who presents for evaluation of the dizziness, diarrhea, crampy abdominal pain, and right arm pain. Patient reports that she started to get sick 5 days ago. She woke up in the middle of the night and had several episodes of vomiting and fever. The vomiting and the fever resolved in 24 hours however she then started having diarrhea. She reports loose stools every time she eats. She took Imodium for the last 2 days and her last diarrhea was this morning. She reports that she has been feeling lightheaded all day today at work. She was googling her symptoms and was concerned that she was dehydrated so came to the emergency room for evaluation.she is also complaining of diffuse abdominal cramps that are usually better after she has a bowel movement. No melena or hematochezia. Patient denies any history of C. difficile or recent antibiotic use. No fever for the last 4 days. No dysuria or vaginal discharge, no chest pain or shortness of breath. Patient also complaining of pain in her right arm that has been present since she woke up today. She reports that the pain starts in her right shoulder and radiates down her arm the pain is sharp and worse with palpation of the shoulder. She is also complaining of pain in her right wrist that has been present since this morning. No trauma.  Past Medical History:  Diagnosis Date  . Anxiety   . Hyperlipidemia   . Irregular bleeding   . Sleep disturbance   . Vitamin D deficiency     Patient Active Problem List   Diagnosis Date Noted  . Vitamin D deficiency 05/16/2016  . Hyperlipidemia 05/16/2016  .  Left knee pain 12/17/2011    Past Surgical History:  Procedure Laterality Date  . CESAREAN SECTION      Prior to Admission medications   Medication Sig Start Date End Date Taking? Authorizing Provider  atorvastatin (LIPITOR) 10 MG tablet Take 1 tablet (10 mg total) by mouth daily. 10/13/16   Shambley, Melody N, CNM  ibuprofen (ADVIL,MOTRIN) 800 MG tablet Take 1 tablet (800 mg total) by mouth every 8 (eight) hours as needed. 01/06/16   Triplett, Cari B, FNP  MICROGESTIN FE 1.5/30 1.5-30 MG-MCG tablet TAKE 1 TABLET BY MOUTH ONCE A DAY 02/02/17   Shambley, Melody N, CNM  norethindrone-ethinyl estradiol-iron (MICROGESTIN FE 1.5/30) 1.5-30 MG-MCG tablet Take 1 tablet by mouth daily. 05/13/16   Shambley, Melody N, CNM  oxyCODONE-acetaminophen (ROXICET) 5-325 MG tablet Take 1 tablet by mouth every 6 (six) hours as needed. 01/06/16   Triplett, Johnette Abraham B, FNP  sertraline (ZOLOFT) 25 MG tablet TAKE 2 TABLETS BY MOUTH ONCE DAILY 02/05/17   Shambley, Melody N, CNM  Vitamin D, Ergocalciferol, (DRISDOL) 50000 units CAPS capsule TAKE 1 CAPSULE BY MOUTH 2 TIMES A WEEK 03/11/17   Shambley, Melody N, CNM    Allergies Patient has no known allergies.  Family History  Problem Relation Age of Onset  . Heart disease Father     Social History Social History  Substance Use Topics  . Smoking status: Never Smoker  . Smokeless tobacco: Never Used  . Alcohol use Yes    Review of Systems  Constitutional: Negative for fever. + lightheadedness Eyes: Negative for visual changes. ENT: Negative for sore throat. Neck: No neck pain  Cardiovascular: Negative for chest pain. Respiratory: Negative for shortness of breath. Gastrointestinal: + abdominal cramping, diarrhea. No vomiting  Genitourinary: Negative for dysuria. Musculoskeletal: Negative for back pain. + R arm pain Skin: Negative for rash. Neurological: Negative for headaches, weakness or numbness. Psych: No SI or  HI  ____________________________________________   PHYSICAL EXAM:  VITAL SIGNS: ED Triage Vitals  Enc Vitals Group     BP 04/29/17 1826 124/76     Pulse Rate 04/29/17 1826 76     Resp 04/29/17 1826 20     Temp 04/29/17 1826 98.4 F (36.9 C)     Temp Source 04/29/17 1826 Oral     SpO2 04/29/17 1826 98 %     Weight 04/29/17 1821 189 lb (85.7 kg)     Height 04/29/17 1821 5\' 5"  (1.651 m)     Head Circumference --      Peak Flow --      Pain Score 04/29/17 1821 6     Pain Loc --      Pain Edu? --      Excl. in [place]? --     Constitutional: Alert and oriented. Well appearing and in no apparent distress. HEENT:      Head: Normocephalic and atraumatic.         Eyes: Conjunctivae are normal. Sclera is non-icteric.       Mouth/Throat: Mucous membranes are moist.       Neck: Supple with no signs of meningismus. Cardiovascular: Regular rate and rhythm. No murmurs, gallops, or rubs. 2+ symmetrical distal pulses are present in all extremities. No JVD. Respiratory: Normal respiratory effort. Lungs are clear to auscultation bilaterally. No wheezes, crackles, or rhonchi.  Gastrointestinal: Soft, mild diffuse tenderness to palpation, and non distended with positive bowel sounds. No rebound or guarding. Genitourinary: No CVA tenderness. Musculoskeletal: patient has full painless range of motion of all joints in her right upper extremity. She has mild tenderness to palpation on the right shoulder anteriorly, no swelling or erythema of any of her joints.Shawn distal pulse, intact sensation and strength. Neurologic: Normal speech and language. Face is symmetric. Moving all extremities. No gross focal neurologic deficits are appreciated. Skin: Skin is warm, dry and intact. No rash noted. Psychiatric: Mood and affect are normal. Speech and behavior are normal.  ____________________________________________   LABS (all labs ordered are listed, but only abnormal results are displayed)  Labs Reviewed   URINALYSIS, COMPLETE (UACMP) WITH MICROSCOPIC - Abnormal; Notable for the following:       Result Value   Color, Urine YELLOW (*)    APPearance HAZY (*)    Hgb urine dipstick SMALL (*)    Leukocytes, UA SMALL (*)    Bacteria, UA RARE (*)    Squamous Epithelial / LPF 0-5 (*)    All other components within normal limits  C DIFFICILE QUICK SCREEN W PCR REFLEX  BASIC METABOLIC PANEL  CBC  TROPONIN I   ____________________________________________  EKG  ED ECG REPORT I, Rudene Re, the attending physician, personally viewed and interpreted this ECG.  Normal sinus rhythm, rate of 77, normal intervals, normal axis, no ST elevations or depressions.no prior for comparison.  ____________________________________________  RADIOLOGY  XR R shoulder: negative   XR R wrist: negative ____________________________________________   PROCEDURES  Procedure(s) performed: None Procedures Critical Care performed:  None ____________________________________________   INITIAL IMPRESSION / ASSESSMENT  AND PLAN / ED COURSE   52 y.o. female with a history of hyperlipidemia and anxiety who presents for evaluation of the dizziness, diarrhea, crampy abdominal pain, and right arm pain.  # lightheadedness, diarrhea, abdominal cramping: freshen diagnoses including dehydration in the setting of food poisoning versus viral gastroenteritis. We'll send for C. difficile patient has a bowel movement in the emergency department. Her abdomen is benign with mild diffuse tenderness, no localizing features. Blood work is normal with no leukocytosis, normal CMP and lipase.UA has rare bacteria but no nitrites and patient has no symptoms of UTI. We'll send a culture and hold off treatment at this time. Her orthostatic vital signs shows stable blood pressure however pulse changes from 76 laying down to 91 standing. We'll give IV fluids, Toradol for cramping abdominal pain and Zofran.  # R arm pain: Her right arm  pain seems to be radicular in nature. Patient has no signs or symptoms of septic or inflammatory joint with full painless range of motion of all her extremities. Also has intact pulses, strength and sensation with no evidence of claudication or blood clots. There is no rash, erythema or warmth. We'll send patient for x-rays of the shoulder and the wrist. She is to receive Toradol for pain.      As part of my medical decision making, I reviewed the following data within the [place] notes reviewed and incorporated, Labs reviewed , Patient signed out to Dr. Kerman Passey, Radiograph reviewed , Notes from prior ED visits and [place] Controlled Substance Database    Pertinent labs & imaging results that were available during my care of the patient were reviewed by me and considered in my medical decision making (see chart for details).    ____________________________________________   FINAL CLINICAL IMPRESSION(S) / ED DIAGNOSES  Final diagnoses:  Abdominal cramping  Diarrhea, unspecified type  Orthostatic dizziness  Right arm pain  Cervical radiculopathy      NEW MEDICATIONS STARTED DURING THIS VISIT:  New Prescriptions   No medications on file     Note:  This document was prepared using Dragon voice recognition software and may include unintentional dictation errors.    Alfred Levins, [place], MD 04/29/17 2030

## 2017-04-29 NOTE — ED Triage Notes (Addendum)
Pt sent from [facility] fo eval of dizziness, right arm pain, diarrhea.  Pt reports abd pain and diarrhea for 5 days.  Last night pt became dizzy and has right arm pain.  No chest pain or sob.  Pt alert.  Speech clear.  Pt ambulates without diff.

## 2017-04-29 NOTE — ED Provider Notes (Signed)
-----------------------------------------   10:13 PM on 04/29/2017 -----------------------------------------  Patient appears very well.  Has not been able to provide a stool sample in the emergency department.  States she does not feel like she is going to be able to.  Her labs are reassuring, vitals are reassuring.  She feels well I discussed with the patient continued use of Imodium if needed at home and following up with her doctor.  Patient agreeable to plan.   Harvest Dark, MD 04/29/17 2213

## 2017-05-01 LAB — URINE CULTURE

## 2017-08-06 ENCOUNTER — Other Ambulatory Visit: Payer: Self-pay | Admitting: Obstetrics and Gynecology

## 2017-12-25 ENCOUNTER — Other Ambulatory Visit: Payer: Self-pay

## 2017-12-25 ENCOUNTER — Ambulatory Visit (INDEPENDENT_AMBULATORY_CARE_PROVIDER_SITE_OTHER): Payer: BLUE CROSS/BLUE SHIELD | Admitting: Certified Nurse Midwife

## 2017-12-25 VITALS — BP 101/67 | HR 84 | Ht 65.0 in | Wt 213.2 lb

## 2017-12-25 DIAGNOSIS — Z0000 Encounter for general adult medical examination without abnormal findings: Secondary | ICD-10-CM | POA: Diagnosis not present

## 2017-12-25 DIAGNOSIS — Z1211 Encounter for screening for malignant neoplasm of colon: Secondary | ICD-10-CM

## 2017-12-25 DIAGNOSIS — E669 Obesity, unspecified: Secondary | ICD-10-CM | POA: Insufficient documentation

## 2017-12-25 DIAGNOSIS — Z1231 Encounter for screening mammogram for malignant neoplasm of breast: Secondary | ICD-10-CM

## 2017-12-25 DIAGNOSIS — Z1239 Encounter for other screening for malignant neoplasm of breast: Secondary | ICD-10-CM

## 2017-12-25 DIAGNOSIS — F339 Major depressive disorder, recurrent, unspecified: Secondary | ICD-10-CM

## 2017-12-25 DIAGNOSIS — Z3041 Encounter for surveillance of contraceptive pills: Secondary | ICD-10-CM | POA: Diagnosis not present

## 2017-12-25 MED ORDER — SERTRALINE HCL 25 MG PO TABS: 50.0000 mg | ORAL_TABLET | Freq: Every day | ORAL | 6 refills | Status: DC

## 2017-12-25 MED ORDER — NA SULFATE-K SULFATE-MG SULF 17.5-3.13-1.6 GM/177ML PO SOLN: 1.0000 | Freq: Once | ORAL | 0 refills | Status: AC

## 2017-12-25 MED ORDER — VITAMIN D (ERGOCALCIFEROL) 1.25 MG (50000 UNIT) PO CAPS: 50000.0000 [IU] | ORAL_CAPSULE | ORAL | 2 refills | Status: DC

## 2017-12-25 MED ORDER — ATORVASTATIN CALCIUM 10 MG PO TABS: 10.0000 mg | ORAL_TABLET | Freq: Every day | ORAL | 11 refills | Status: DC

## 2017-12-25 MED ORDER — SERTRALINE HCL 50 MG PO TABS: 50.0000 mg | ORAL_TABLET | Freq: Every day | ORAL | 6 refills | Status: DC

## 2017-12-25 MED ORDER — NORETHIN ACE-ETH ESTRAD-FE 1.5-30 MG-MCG PO TABS: 1.0000 | ORAL_TABLET | Freq: Every day | ORAL | 4 refills | Status: DC

## 2017-12-25 NOTE — Progress Notes (Signed)
Pt is here for an annual exam. LPS 2017 WNL.

## 2017-12-25 NOTE — Patient Instructions (Signed)
Preventive Care 40-64 Years, Female Preventive care refers to lifestyle choices and visits with your health care provider that can promote health and wellness. What does preventive care include?  A yearly physical exam. This is also called an annual well check.  Dental exams once or twice a year.  Routine eye exams. Ask your health care provider how often you should have your eyes checked.  Personal lifestyle choices, including: ? Daily care of your teeth and gums. ? Regular physical activity. ? Eating a healthy diet. ? Avoiding tobacco and drug use. ? Limiting alcohol use. ? Practicing safe sex. ? Taking low-dose aspirin daily starting at age 58. ? Taking vitamin and mineral supplements as recommended by your health care provider. What happens during an annual well check? The services and screenings done by your health care provider during your annual well check will depend on your age, overall health, lifestyle risk factors, and family history of disease. Counseling Your health care provider may ask you questions about your:  Alcohol use.  Tobacco use.  Drug use.  Emotional well-being.  Home and relationship well-being.  Sexual activity.  Eating habits.  Work and work Statistician.  Method of birth control.  Menstrual cycle.  Pregnancy history.  Screening You may have the following tests or measurements:  Height, weight, and BMI.  Blood pressure.  Lipid and cholesterol levels. These may be checked every 5 years, or more frequently if you are over 81 years old.  Skin check.  Lung cancer screening. You may have this screening every year starting at age 78 if you have a 30-pack-year history of smoking and currently smoke or have quit within the past 15 years.  Fecal occult blood test (FOBT) of the stool. You may have this test every year starting at age 65.  Flexible sigmoidoscopy or colonoscopy. You may have a sigmoidoscopy every 5 years or a colonoscopy  every 10 years starting at age 30.  Hepatitis C blood test.  Hepatitis B blood test.  Sexually transmitted disease (STD) testing.  Diabetes screening. This is done by checking your blood sugar (glucose) after you have not eaten for a while (fasting). You may have this done every 1-3 years.  Mammogram. This may be done every 1-2 years. Talk to your health care provider about when you should start having regular mammograms. This may depend on whether you have a family history of breast cancer.  BRCA-related cancer screening. This may be done if you have a family history of breast, ovarian, tubal, or peritoneal cancers.  Pelvic exam and Pap test. This may be done every 3 years starting at age 80. Starting at age 36, this may be done every 5 years if you have a Pap test in combination with an HPV test.  Bone density scan. This is done to screen for osteoporosis. You may have this scan if you are at high risk for osteoporosis.  Discuss your test results, treatment options, and if necessary, the need for more tests with your health care provider. Vaccines Your health care provider may recommend certain vaccines, such as:  Influenza vaccine. This is recommended every year.  Tetanus, diphtheria, and acellular pertussis (Tdap, Td) vaccine. You may need a Td booster every 10 years.  Varicella vaccine. You may need this if you have not been vaccinated.  Zoster vaccine. You may need this after age 5.  Measles, mumps, and rubella (MMR) vaccine. You may need at least one dose of MMR if you were born in  1957 or later. You may also need a second dose.  Pneumococcal 13-valent conjugate (PCV13) vaccine. You may need this if you have certain conditions and were not previously vaccinated.  Pneumococcal polysaccharide (PPSV23) vaccine. You may need one or two doses if you smoke cigarettes or if you have certain conditions.  Meningococcal vaccine. You may need this if you have certain  conditions.  Hepatitis A vaccine. You may need this if you have certain conditions or if you travel or work in places where you may be exposed to hepatitis A.  Hepatitis B vaccine. You may need this if you have certain conditions or if you travel or work in places where you may be exposed to hepatitis B.  Haemophilus influenzae type b (Hib) vaccine. You may need this if you have certain conditions.  Talk to your health care provider about which screenings and vaccines you need and how often you need them. This information is not intended to replace advice given to you by your health care provider. Make sure you discuss any questions you have with your health care provider. Document Released: 07/20/2015 Document Revised: 03/12/2016 Document Reviewed: 04/24/2015 Elsevier Interactive Patient Education  2018 Elsevier Inc.  

## 2017-12-25 NOTE — Progress Notes (Signed)
GYNECOLOGY ANNUAL PREVENTATIVE CARE ENCOUNTER NOTE  Subjective:   Angela Wu is a 52 y.o. G31P0 female here for a routine annual gynecologic exam.  Current complaints: None.   Denies abnormal vaginal bleeding, discharge, pelvic pain, problems with intercourse or other gynecologic concerns.    Gynecologic History Patient's last menstrual period was 12/02/2017 (approximate). Contraception: OCP (estrogen/progesterone) Last Pap: 05/13/2016 Results were: normal with negative HPV Last mammogram: 06/2014. Results were: normal  Obstetric History OB History  Gravida Para Term Preterm AB Living  2            SAB TAB Ectopic Multiple Live Births               # Outcome Date GA Lbr Len/2nd Weight Sex Delivery Anes PTL Lv  2 Gravida 2002    M VBAC     1 Gravida 1999    F CS-Unspec       Past Medical History:  Diagnosis Date  . Anxiety   . Hyperlipidemia   . Irregular bleeding   . Sleep disturbance   . Vitamin D deficiency     Past Surgical History:  Procedure Laterality Date  . CESAREAN SECTION      Current Outpatient Medications on File Prior to Visit  Medication Sig Dispense Refill  . atorvastatin (LIPITOR) 10 MG tablet TAKE 1 TABLET BY MOUTH DAILY 30 tablet 6  . DULoxetine (CYMBALTA) 30 MG capsule Take by mouth.    Marland Kitchen ibuprofen (ADVIL,MOTRIN) 800 MG tablet Take 1 tablet (800 mg total) by mouth every 8 (eight) hours as needed. 30 tablet 0  . MICROGESTIN FE 1.5/30 1.5-30 MG-MCG tablet TAKE 1 TABLET BY MOUTH ONCE A DAY 3 Package 4  . predniSONE (DELTASONE) 50 MG tablet     . sertraline (ZOLOFT) 25 MG tablet TAKE 2 TABLETS BY MOUTH ONCE DAILY 60 tablet 6  . Vitamin D, Ergocalciferol, (DRISDOL) 50000 units CAPS capsule TAKE 1 CAPSULE BY MOUTH 2 TIMES A WEEK 30 capsule 2   No current facility-administered medications on file prior to visit.     No Known Allergies  Social History   Socioeconomic History  . Marital status: Married    Spouse name: Not on file  . Number of  children: Not on file  . Years of education: Not on file  . Highest education level: Not on file  Occupational History  . Not on file  Social Needs  . Financial resource strain: Not on file  . Food insecurity:    Worry: Not on file    Inability: Not on file  . Transportation needs:    Medical: Not on file    Non-medical: Not on file  Tobacco Use  . Smoking status: Never Smoker  . Smokeless tobacco: Never Used  Substance and Sexual Activity  . Alcohol use: Yes  . Drug use: No  . Sexual activity: Never    Birth control/protection: Pill  Lifestyle  . Physical activity:    Days per week: Not on file    Minutes per session: Not on file  . Stress: Not on file  Relationships  . Social connections:    Talks on phone: Not on file    Gets together: Not on file    Attends religious service: Not on file    Active member of club or organization: Not on file    Attends meetings of clubs or organizations: Not on file    Relationship status: Not on file  . Intimate partner violence:  Fear of current or ex partner: Not on file    Emotionally abused: Not on file    Physically abused: Not on file    Forced sexual activity: Not on file  Other Topics Concern  . Not on file  Social History Narrative  . Not on file    Family History  Problem Relation Age of Onset  . Heart disease Father     The following portions of the patient's history were reviewed and updated as appropriate: allergies, current medications, past family history, past medical history, past social history, past surgical history and problem list.  Review of Systems Pertinent items noted in HPI and remainder of comprehensive ROS otherwise negative.   Objective:  BP 101/67   Pulse 84   Ht 5\' 5"  (1.651 m)   Wt 213 lb 4 oz (96.7 kg)   LMP 12/02/2017 (Approximate)   BMI 35.49 kg/m  CONSTITUTIONAL: Well-developed, well-nourished obese female in no acute distress.  HENT:  Normocephalic, atraumatic, External right  and left ear normal. Oropharynx is clear and moist EYES: Conjunctivae and EOM are normal. Pupils are equal, round, and reactive to light. No scleral icterus.  NECK: Normal range of motion, supple, no masses.  Normal thyroid.  SKIN: Skin is warm and dry. No rash noted. Not diaphoretic. No erythema. No pallor. MUSCULOSKELETAL: Normal range of motion. No tenderness.  No cyanosis, clubbing, or edema.  2+ distal pulses. NEUROLOGIC: Alert and oriented to person, place, and time. Normal reflexes, muscle tone coordination. No cranial nerve deficit noted. PSYCHIATRIC: Normal mood and affect. Normal behavior. Normal judgment and thought content. CARDIOVASCULAR: Normal heart rate noted, regular rhythm RESPIRATORY: Clear to auscultation bilaterally. Effort and breath sounds normal, no problems with respiration noted. BREASTS: Symmetric in size. No masses, skin changes, nipple drainage, or lymphadenopathy. ABDOMEN: Soft, normal bowel sounds, no distention noted.  No tenderness, rebound or guarding.  PELVIC: Normal appearing external genitalia; normal appearing vaginal mucosa and cervix.  No abnormal discharge noted.  Pap smear not indicated.  Normal uterine size, no other palpable masses, no uterine or adnexal tenderness.    Assessment and Plan:  Well Women exam Mammogram ordered Colonoscopy ordered Labs-none Routine preventative health maintenance measures emphasized. Please refer to After Visit Summary for other counseling recommendations.    Philip Aspen, CNM

## 2018-01-20 ENCOUNTER — Encounter: Payer: Self-pay | Admitting: Anesthesiology

## 2018-01-20 ENCOUNTER — Encounter: Payer: Self-pay | Admitting: *Deleted

## 2018-01-20 ENCOUNTER — Other Ambulatory Visit: Payer: Self-pay

## 2018-03-30 ENCOUNTER — Encounter: Payer: BLUE CROSS/BLUE SHIELD | Admitting: Obstetrics and Gynecology

## 2018-04-27 ENCOUNTER — Other Ambulatory Visit: Payer: Self-pay

## 2018-04-27 MED ORDER — VITAMIN D (ERGOCALCIFEROL) 1.25 MG (50000 UNIT) PO CAPS: 50000.0000 [IU] | ORAL_CAPSULE | ORAL | 0 refills | Status: DC

## 2018-04-27 MED ORDER — SERTRALINE HCL 25 MG PO TABS: ORAL_TABLET | ORAL | 0 refills | Status: DC

## 2018-04-27 MED ORDER — ATORVASTATIN CALCIUM 10 MG PO TABS: 10.0000 mg | ORAL_TABLET | Freq: Every day | ORAL | 0 refills | Status: DC

## 2018-04-28 ENCOUNTER — Other Ambulatory Visit: Payer: Self-pay

## 2018-04-28 ENCOUNTER — Encounter: Payer: Self-pay | Admitting: *Deleted

## 2018-05-03 ENCOUNTER — Telehealth: Payer: Self-pay | Admitting: Obstetrics and Gynecology

## 2018-05-03 NOTE — Telephone Encounter (Signed)
Patient called stating she has a colonoscopy scheduled for 10/30. She states she has a hard time with hemorrhoids and didn't know if getting the colonoscopy will cause her to get them. She also wanted to know if there is any other options instead of getting a colonoscopy.Thanks

## 2018-05-04 ENCOUNTER — Telehealth: Payer: Self-pay

## 2018-05-04 NOTE — Discharge Instructions (Signed)
General Anesthesia, Adult, Care After °These instructions provide you with information about caring for yourself after your procedure. Your health care provider may also give you more specific instructions. Your treatment has been planned according to current medical practices, but problems sometimes occur. Call your health care provider if you have any problems or questions after your procedure. °What can I expect after the procedure? °After the procedure, it is common to have: °· Vomiting. °· A sore throat. °· Mental slowness. ° °It is common to feel: °· Nauseous. °· Cold or shivery. °· Sleepy. °· Tired. °· Sore or achy, even in parts of your body where you did not have surgery. ° °Follow these instructions at home: °For at least 24 hours after the procedure: °· Do not: °? Participate in activities where you could fall or become injured. °? Drive. °? Use heavy machinery. °? Drink alcohol. °? Take sleeping pills or medicines that cause drowsiness. °? Make important decisions or sign legal documents. °? Take care of children on your own. °· Rest. °Eating and drinking °· If you vomit, drink water, juice, or soup when you can drink without vomiting. °· Drink enough fluid to keep your urine clear or pale yellow. °· Make sure you have little or no nausea before eating solid foods. °· Follow the diet recommended by your health care provider. °General instructions °· Have a responsible adult stay with you until you are awake and alert. °· Return to your normal activities as told by your health care provider. Ask your health care provider what activities are safe for you. °· Take over-the-counter and prescription medicines only as told by your health care provider. °· If you smoke, do not smoke without supervision. °· Keep all follow-up visits as told by your health care provider. This is important. °Contact a health care provider if: °· You continue to have nausea or vomiting at home, and medicines are not helpful. °· You  cannot drink fluids or start eating again. °· You cannot urinate after 8-12 hours. °· You develop a skin rash. °· You have fever. °· You have increasing redness at the site of your procedure. °Get help right away if: °· You have difficulty breathing. °· You have chest pain. °· You have unexpected bleeding. °· You feel that you are having a life-threatening or urgent problem. °This information is not intended to replace advice given to you by your health care provider. Make sure you discuss any questions you have with your health care provider. °Document Released: 09/29/2000 Document Revised: 11/26/2015 Document Reviewed: 06/07/2015 °Elsevier Interactive Patient Education © 2018 Elsevier Inc. ° °

## 2018-05-04 NOTE — Telephone Encounter (Signed)
Returned pts call- she states she has an appointment tomorrow 05/05/18 for a colonoscopy. She also states her father refuses to take her telling her colonoscopy "can give you hemorrhoids". Pt has a history of them from childbirth per pt. Explained to her that the insurance companies suggest at age 52 pts get a colonoscopy. Another option offered to pt is stool cards. Explained the process to her and she replied "if I get the cards back to you tomorrow can they be processed before 05/07/18 when ins runs out". Reassured they will be entered into system upon receipt. Pt has decided to cancel appointment for colonoscopy tomorrow and reschedule appt for Jan. when her sister can bring her. She was very appreciative for the help and options she was given. Stool cards up front with pts name awaiting pickup.

## 2018-05-04 NOTE — Telephone Encounter (Signed)
The patient called again this morning wanting to speak to her nurse about the upcoming procedure.  She has questions and is not sure if she even wants to have the colonoscopy and her parents are actually telling her they will not take her until she speaks to someone about it.  Please advise, thanks.

## 2018-05-04 NOTE — Telephone Encounter (Signed)
pls advise

## 2018-05-04 NOTE — Telephone Encounter (Signed)
Can you see if you can get more clarity  On this as she has procedure tomorrow.  I didn't refer her for it so not sure what she is referencing to. Or what she needs from me.

## 2018-05-05 ENCOUNTER — Other Ambulatory Visit: Payer: Self-pay

## 2018-05-05 DIAGNOSIS — Z1211 Encounter for screening for malignant neoplasm of colon: Secondary | ICD-10-CM

## 2018-05-07 ENCOUNTER — Telehealth: Payer: Self-pay

## 2018-05-07 LAB — FECAL OCCULT BLOOD, IMMUNOCHEMICAL: Fecal Occult Bld: NEGATIVE

## 2018-05-07 NOTE — Telephone Encounter (Signed)
Pt informed neg stool for OB cards per AT.

## 2018-07-28 ENCOUNTER — Ambulatory Visit
Admission: RE | Admit: 2018-07-28 | Payer: BLUE CROSS/BLUE SHIELD | Source: Ambulatory Visit | Admitting: Gastroenterology

## 2018-07-28 HISTORY — DX: Dizziness and giddiness: R42

## 2018-07-28 HISTORY — DX: Scoliosis, unspecified: M41.9

## 2018-07-28 HISTORY — DX: Unspecified osteoarthritis, unspecified site: M19.90

## 2018-07-28 SURGERY — COLONOSCOPY WITH PROPOFOL
Anesthesia: Choice

## 2018-08-12 ENCOUNTER — Other Ambulatory Visit: Payer: Self-pay | Admitting: Certified Nurse Midwife

## 2018-09-03 ENCOUNTER — Telehealth: Payer: Self-pay | Admitting: Certified Nurse Midwife

## 2018-09-03 NOTE — Telephone Encounter (Signed)
Spoke with patient, Patient voiced understanding and stated she will contact [facility] to schedule an appointment. Thank you!

## 2018-09-03 NOTE — Telephone Encounter (Signed)
The patient called and stated that she would like to speak with her nurse or provider in regards to her having concerns of colon cancer and her wanting to schedule a colposcopy. The patient is requesting a call back. Please advise.

## 2018-12-24 ENCOUNTER — Telehealth: Payer: Self-pay

## 2018-12-24 NOTE — Telephone Encounter (Signed)
Pt prescreened no symptoms has face mask. Sent link to activate mychart.    Coronavirus (COVID-19) Are you at risk?  Are you at risk for the Coronavirus (COVID-19)?  To be considered HIGH RISK for Coronavirus (COVID-19), you have to meet the following criteria:  . Traveled to [place], [place], [place], [place] or [place]; or in the [place] to [place], [place], [place], or [place]; and have fever, cough, and shortness of breath within the last 2 weeks of travel OR . Been in close contact with a person diagnosed with COVID-19 within the last 2 weeks and have fever, cough, and shortness of breath . IF YOU DO NOT MEET THESE CRITERIA, YOU ARE CONSIDERED LOW RISK FOR COVID-19.  What to do if you are HIGH RISK for COVID-19?  Marland Kitchen If you are having a medical emergency, call 911. . Seek medical care right away. Before you go to a doctor's office, urgent care or emergency department, call ahead and tell them about your recent travel, contact with someone diagnosed with COVID-19, and your symptoms. You should receive instructions from your physician's office regarding next steps of care.  . When you arrive at healthcare provider, tell the healthcare staff immediately you have returned from visiting [place], [place], [place], [place] or [place]; or traveled in the [place] to [place], [place], [place], or [place]; in the last two weeks or you have been in close contact with a person diagnosed with COVID-19 in the last 2 weeks.   . Tell the health care staff about your symptoms: fever, cough and shortness of breath. . After you have been seen by a medical provider, you will be either: o Tested for (COVID-19) and discharged home on quarantine except to seek medical care if symptoms worsen, and asked to  - Stay home and avoid contact with others until you get your results (4-5 days)  - Avoid travel on public transportation if possible (such as bus, train, or airplane) or o Sent to the  Emergency Department by EMS for evaluation, COVID-19 testing, and possible admission depending on your condition and test results.  What to do if you are LOW RISK for COVID-19?  Reduce your risk of any infection by using the same precautions used for avoiding the common cold or flu:  Marland Kitchen Wash your hands often with soap and warm water for at least 20 seconds.  If soap and water are not readily available, use an alcohol-based hand sanitizer with at least 60% alcohol.  . If coughing or sneezing, cover your mouth and nose by coughing or sneezing into the elbow areas of your shirt or coat, into a tissue or into your sleeve (not your hands). . Avoid shaking hands with others and consider head nods or verbal greetings only. . Avoid touching your eyes, nose, or mouth with unwashed hands.  . Avoid close contact with people who are sick. . Avoid places or events with large numbers of people in one location, like concerts or sporting events. . Carefully consider travel plans you have or are making. . If you are planning any travel outside or inside the [place], visit the CDC's Travelers' Health webpage for the latest health notices. . If you have some symptoms but not all symptoms, continue to monitor at home and seek medical attention if your symptoms worsen. . If you are having a medical emergency, call 911.   ADDITIONAL HEALTHCARE OPTIONS FOR PATIENTS  [facility] / e-Visit: [email or web address]  [facility]: [phone]  [facility]: [phone]                   [facility]: [phone]  

## 2018-12-27 ENCOUNTER — Ambulatory Visit (INDEPENDENT_AMBULATORY_CARE_PROVIDER_SITE_OTHER): Payer: BC Managed Care – PPO | Admitting: Certified Nurse Midwife

## 2018-12-27 ENCOUNTER — Other Ambulatory Visit: Payer: Self-pay

## 2018-12-27 ENCOUNTER — Encounter: Payer: Self-pay | Admitting: Certified Nurse Midwife

## 2018-12-27 VITALS — BP 109/81 | HR 84 | Ht 65.0 in | Wt 212.5 lb

## 2018-12-27 DIAGNOSIS — Z3041 Encounter for surveillance of contraceptive pills: Secondary | ICD-10-CM

## 2018-12-27 DIAGNOSIS — Z1211 Encounter for screening for malignant neoplasm of colon: Secondary | ICD-10-CM

## 2018-12-27 DIAGNOSIS — Z1239 Encounter for other screening for malignant neoplasm of breast: Secondary | ICD-10-CM | POA: Diagnosis not present

## 2018-12-27 MED ORDER — SERTRALINE HCL 50 MG PO TABS: 50.0000 mg | ORAL_TABLET | Freq: Every day | ORAL | 6 refills | Status: DC

## 2018-12-27 MED ORDER — NORETHIN ACE-ETH ESTRAD-FE 1.5-30 MG-MCG PO TABS: 1.0000 | ORAL_TABLET | Freq: Every day | ORAL | 4 refills | Status: DC

## 2018-12-27 NOTE — Patient Instructions (Signed)
Preventive Care 40-64 Years, Female Preventive care refers to lifestyle choices and visits with your health care provider that can promote health and wellness. What does preventive care include?   A yearly physical exam. This is also called an annual well check.  Dental exams once or twice a year.  Routine eye exams. Ask your health care provider how often you should have your eyes checked.  Personal lifestyle choices, including: ? Daily care of your teeth and gums. ? Regular physical activity. ? Eating a healthy diet. ? Avoiding tobacco and drug use. ? Limiting alcohol use. ? Practicing safe sex. ? Taking low-dose aspirin daily starting at age 50. ? Taking vitamin and mineral supplements as recommended by your health care provider. What happens during an annual well check? The services and screenings done by your health care provider during your annual well check will depend on your age, overall health, lifestyle risk factors, and family history of disease. Counseling Your health care provider may ask you questions about your:  Alcohol use.  Tobacco use.  Drug use.  Emotional well-being.  Home and relationship well-being.  Sexual activity.  Eating habits.  Work and work environment.  Method of birth control.  Menstrual cycle.  Pregnancy history. Screening You may have the following tests or measurements:  Height, weight, and BMI.  Blood pressure.  Lipid and cholesterol levels. These may be checked every 5 years, or more frequently if you are over 50 years old.  Skin check.  Lung cancer screening. You may have this screening every year starting at age 55 if you have a 30-pack-year history of smoking and currently smoke or have quit within the past 15 years.  Colorectal cancer screening. All adults should have this screening starting at age 50 and continuing until age 75. Your health care provider may recommend screening at age 45. You will have tests every  1-10 years, depending on your results and the type of screening test. People at increased risk should start screening at an earlier age. Screening tests may include: ? Guaiac-based fecal occult blood testing. ? Fecal immunochemical test (FIT). ? Stool DNA test. ? Virtual colonoscopy. ? Sigmoidoscopy. During this test, a flexible tube with a tiny camera (sigmoidoscope) is used to examine your rectum and lower colon. The sigmoidoscope is inserted through your anus into your rectum and lower colon. ? Colonoscopy. During this test, a long, thin, flexible tube with a tiny camera (colonoscope) is used to examine your entire colon and rectum.  Hepatitis C blood test.  Hepatitis B blood test.  Sexually transmitted disease (STD) testing.  Diabetes screening. This is done by checking your blood sugar (glucose) after you have not eaten for a while (fasting). You may have this done every 1-3 years.  Mammogram. This may be done every 1-2 years. Talk to your health care provider about when you should start having regular mammograms. This may depend on whether you have a family history of breast cancer.  BRCA-related cancer screening. This may be done if you have a family history of breast, ovarian, tubal, or peritoneal cancers.  Pelvic exam and Pap test. This may be done every 3 years starting at age 21. Starting at age 30, this may be done every 5 years if you have a Pap test in combination with an HPV test.  Bone density scan. This is done to screen for osteoporosis. You may have this scan if you are at high risk for osteoporosis. Discuss your test results, treatment options,   and if necessary, the need for more tests with your health care provider. Vaccines Your health care provider may recommend certain vaccines, such as:  Influenza vaccine. This is recommended every year.  Tetanus, diphtheria, and acellular pertussis (Tdap, Td) vaccine. You may need a Td booster every 10 years.  Varicella  vaccine. You may need this if you have not been vaccinated.  Zoster vaccine. You may need this after age 38.  Measles, mumps, and rubella (MMR) vaccine. You may need at least one dose of MMR if you were born in 1957 or later. You may also need a second dose.  Pneumococcal 13-valent conjugate (PCV13) vaccine. You may need this if you have certain conditions and were not previously vaccinated.  Pneumococcal polysaccharide (PPSV23) vaccine. You may need one or two doses if you smoke cigarettes or if you have certain conditions.  Meningococcal vaccine. You may need this if you have certain conditions.  Hepatitis A vaccine. You may need this if you have certain conditions or if you travel or work in places where you may be exposed to hepatitis A.  Hepatitis B vaccine. You may need this if you have certain conditions or if you travel or work in places where you may be exposed to hepatitis B.  Haemophilus influenzae type b (Hib) vaccine. You may need this if you have certain conditions. Talk to your health care provider about which screenings and vaccines you need and how often you need them. This information is not intended to replace advice given to you by your health care provider. Make sure you discuss any questions you have with your health care provider. Document Released: 07/20/2015 Document Revised: 08/13/2017 Document Reviewed: 04/24/2015 Elsevier Interactive Patient Education  2019 [employer].

## 2018-12-27 NOTE — Progress Notes (Signed)
GYNECOLOGY ANNUAL PREVENTATIVE CARE ENCOUNTER NOTE  History:     Angela Wu is a 53 y.o. G47P2002 female here for a routine annual gynecologic exam.  Current complaints: none. Has started weight watchers and lost 4 lbs.    Denies abnormal vaginal bleeding, discharge, pelvic pain, problems with intercourse or other gynecologic concerns.    Gynecologic History Patient's last menstrual period was 12/13/2018 (exact date). Contraception: OCP (estrogen/progesterone) Last Pap: 05/13/16 Results were: normal with negative HPV Last mammogram: 2015. Results were: normal  Obstetric History OB History  Gravida Para Term Preterm AB Living  2 2 2     2   SAB TAB Ectopic Multiple Live Births          2    # Outcome Date GA Lbr Len/2nd Weight Sex Delivery Anes PTL Lv  2 Term 2002 [redacted]w[redacted]d  9 lb 2 oz (4.139 kg) M VBAC  N LIV  1 Term 1999 [redacted]w[redacted]d  8 lb 7 oz (3.827 kg) F CS-Unspec  N LIV    Past Medical History:  Diagnosis Date  . Anxiety   . Arthritis    hands  . Hyperlipidemia   . Irregular bleeding   . Scoliosis    Dx as child. no current issues  . Sleep disturbance   . Vertigo    no episodes approx 6 mos  . Vitamin D deficiency     Past Surgical History:  Procedure Laterality Date  . CESAREAN SECTION    . KNEE SURGERY Left    fractured knee cap    Current Outpatient Medications on File Prior to Visit  Medication Sig Dispense Refill  . fluticasone (FLONASE) 50 MCG/ACT nasal spray Place into the nose.    . ibuprofen (ADVIL,MOTRIN) 800 MG tablet Take 1 tablet (800 mg total) by mouth every 8 (eight) hours as needed. 30 tablet 0  . norethindrone-ethinyl estradiol-iron (MICROGESTIN FE 1.5/30) 1.5-30 MG-MCG tablet Take 1 tablet by mouth daily. 3 Package 4  . sertraline (ZOLOFT) 50 MG tablet Take 1 tablet (50 mg total) by mouth daily. Take 1/2 tablet Twice a day 30 tablet 6  . Vitamin D, Ergocalciferol, (DRISDOL) 50000 units CAPS capsule Take 1 capsule (50,000 Units total) by mouth every 7  (seven) days. 84 capsule 0  . atorvastatin (LIPITOR) 10 MG tablet Take 1 tablet (10 mg total) by mouth daily. 90 tablet 0   No current facility-administered medications on file prior to visit.     No Known Allergies  Social History:  reports that she has never smoked. She has never used smokeless tobacco. She reports current alcohol use. She reports that she does not use drugs.  Walking daily, started weight watchers   Family History  Problem Relation Age of Onset  . Heart disease Father   . Cancer Mother     The following portions of the patient's history were reviewed and updated as appropriate: allergies, current medications, past family history, past medical history, past social history, past surgical history and problem list.  Review of Systems Pertinent items noted in HPI and remainder of comprehensive ROS otherwise negative.  Physical Exam:  BP 109/81   Pulse 84   Ht 5\' 5"  (1.651 m)   Wt 212 lb 8 oz (96.4 kg)   LMP 12/13/2018 (Exact Date)   BMI 35.36 kg/m  CONSTITUTIONAL: Well-developed, well-nourished, obese female in no acute distress.  HENT:  Normocephalic, atraumatic, External right and left ear normal. Oropharynx is clear and moist EYES: Conjunctivae and EOM are  normal. Pupils are equal, round, and reactive to light. No scleral icterus.  NECK: Normal range of motion, supple, no masses.  Normal thyroid.  SKIN: Skin is warm and dry. No rash noted. Not diaphoretic. No erythema. No pallor. MUSCULOSKELETAL: Normal range of motion. No tenderness.  No cyanosis, clubbing, or edema.  2+ distal pulses. NEUROLOGIC: Alert and oriented to person, place, and time. Normal reflexes, muscle tone coordination. No cranial nerve deficit noted. PSYCHIATRIC: Normal mood and affect. Normal behavior. Normal judgment and thought content. CARDIOVASCULAR: Normal heart rate noted, regular rhythm RESPIRATORY: Clear to auscultation bilaterally. Effort and breath sounds normal, no problems with  respiration noted. BREASTS: Symmetric in size. No masses, skin changes, nipple drainage, or lymphadenopathy. ABDOMEN: Soft, normal bowel sounds, no distention noted.  No tenderness, rebound or guarding.  PELVIC: Normal appearing external genitalia; normal appearing vaginal mucosa and cervix.  No abnormal discharge noted.  Pap smear not indicated  Normal uterine size, no other palpable masses, no uterine or adnexal tenderness.   Assessment and Plan:  Annual Well Women GYN exam . Pap smear not indicated  Mammogram ordered Colonoscopy: pt declined last year and had stool sample that was negative. Pt mother dx with colon cancer, states she would like to have done this year. Order placed. Labs: Had Lipid panel 05/2016, glucose done 04/2017, TSH 05/2016 Refill: OCPs, sertraline  Routine preventative health maintenance measures emphasized. Please refer to After Visit Summary for other counseling recommendations.      Philip Aspen, CNM  Philip Aspen, CNM

## 2019-01-03 ENCOUNTER — Telehealth: Payer: Self-pay | Admitting: Certified Nurse Midwife

## 2019-01-03 NOTE — Telephone Encounter (Signed)
The patient called and stated that she needs to speak with her provider to ask a few follow up questions. Please advise.

## 2019-01-04 ENCOUNTER — Telehealth: Payer: Self-pay

## 2019-01-04 NOTE — Telephone Encounter (Signed)
Returned the patients call- she had questions about the testing for colon cancer. She was redirected to the Gastroenterologist/ colorectal specialists.

## 2019-01-11 ENCOUNTER — Telehealth: Payer: Self-pay

## 2019-01-11 ENCOUNTER — Other Ambulatory Visit: Payer: Self-pay

## 2019-01-11 DIAGNOSIS — Z800 Family history of malignant neoplasm of digestive organs: Secondary | ICD-10-CM

## 2019-01-11 DIAGNOSIS — Z1211 Encounter for screening for malignant neoplasm of colon: Secondary | ICD-10-CM

## 2019-01-11 MED ORDER — NA SULFATE-K SULFATE-MG SULF 17.5-3.13-1.6 GM/177ML PO SOLN: 1.0000 | Freq: Once | ORAL | 0 refills | Status: AC

## 2019-01-11 NOTE — Telephone Encounter (Signed)
Gastroenterology Pre-Procedure Review  Request Date: 01/27/19 Requesting Physician: Dr. Bonna Gains  PATIENT REVIEW QUESTIONS: The patient responded to the following health history questions as indicated:    1. Are you having any GI issues? no 2. Do you have a personal history of Polyps? no 3. Do you have a family history of Colon Cancer or Polyps? yes (uncle polps, mother colon cancer) 4. Diabetes Mellitus? no 5. Joint replacements in the past 12 months?no 6. Major health problems in the past 3 months?no 7. Any artificial heart valves, MVP, or defibrillator?no    MEDICATIONS & ALLERGIES:    Patient reports the following regarding taking any anticoagulation/antiplatelet therapy:   Plavix, Coumadin, Eliquis, Xarelto, Lovenox, Pradaxa, Brilinta, or Effient? no Aspirin? no  Patient confirms/reports the following medications:  Current Outpatient Medications  Medication Sig Dispense Refill  . atorvastatin (LIPITOR) 10 MG tablet Take 1 tablet (10 mg total) by mouth daily. 90 tablet 0  . fluticasone (FLONASE) 50 MCG/ACT nasal spray Place into the nose.    . ibuprofen (ADVIL,MOTRIN) 800 MG tablet Take 1 tablet (800 mg total) by mouth every 8 (eight) hours as needed. 30 tablet 0  . norethindrone-ethinyl estradiol-iron (MICROGESTIN FE 1.5/30) 1.5-30 MG-MCG tablet Take 1 tablet by mouth daily. 3 Package 4  . sertraline (ZOLOFT) 50 MG tablet Take 1 tablet (50 mg total) by mouth daily. Take 1/2 tablet Twice a day 30 tablet 6  . Vitamin D, Ergocalciferol, (DRISDOL) 50000 units CAPS capsule Take 1 capsule (50,000 Units total) by mouth every 7 (seven) days. 84 capsule 0   No current facility-administered medications for this visit.     Patient confirms/reports the following allergies:  No Known Allergies  No orders of the defined types were placed in this encounter.   AUTHORIZATION INFORMATION Primary Insurance: 1D#: Group #:  Secondary Insurance: 1D#: Group #:  SCHEDULE  INFORMATION: Date: 01/27/19 Time: Location:[place] [facility]

## 2019-01-24 ENCOUNTER — Other Ambulatory Visit: Payer: Self-pay

## 2019-01-24 ENCOUNTER — Other Ambulatory Visit
Admission: RE | Admit: 2019-01-24 | Discharge: 2019-01-24 | Disposition: A | Discharge status: Home / Self Care | Payer: BC Managed Care – PPO | Source: Ambulatory Visit | Attending: Gastroenterology | Admitting: Gastroenterology

## 2019-01-24 DIAGNOSIS — Z1159 Encounter for screening for other viral diseases: Secondary | ICD-10-CM | POA: Insufficient documentation

## 2019-01-24 LAB — SARS CORONAVIRUS 2 (TAT 6-24 HRS): SARS Coronavirus 2: NEGATIVE

## 2019-01-27 ENCOUNTER — Ambulatory Visit: Payer: BC Managed Care – PPO | Admitting: Certified Registered Nurse Anesthetist

## 2019-01-27 ENCOUNTER — Encounter: Payer: Self-pay | Admitting: Emergency Medicine

## 2019-01-27 ENCOUNTER — Encounter
Admission: RE | Disposition: A | Discharge status: Home / Self Care | Payer: Self-pay | Source: Ambulatory Visit | Attending: Gastroenterology

## 2019-01-27 ENCOUNTER — Ambulatory Visit
Admission: RE | Admit: 2019-01-27 | Discharge: 2019-01-27 | Disposition: A | Discharge status: Home / Self Care | Payer: BC Managed Care – PPO | Source: Ambulatory Visit | Attending: Gastroenterology | Admitting: Gastroenterology

## 2019-01-27 DIAGNOSIS — M1990 Unspecified osteoarthritis, unspecified site: Secondary | ICD-10-CM | POA: Diagnosis not present

## 2019-01-27 DIAGNOSIS — Z1211 Encounter for screening for malignant neoplasm of colon: Secondary | ICD-10-CM | POA: Diagnosis not present

## 2019-01-27 DIAGNOSIS — Z793 Long term (current) use of hormonal contraceptives: Secondary | ICD-10-CM | POA: Diagnosis not present

## 2019-01-27 DIAGNOSIS — M419 Scoliosis, unspecified: Secondary | ICD-10-CM | POA: Insufficient documentation

## 2019-01-27 DIAGNOSIS — E785 Hyperlipidemia, unspecified: Secondary | ICD-10-CM | POA: Insufficient documentation

## 2019-01-27 DIAGNOSIS — Z79899 Other long term (current) drug therapy: Secondary | ICD-10-CM | POA: Diagnosis not present

## 2019-01-27 DIAGNOSIS — E559 Vitamin D deficiency, unspecified: Secondary | ICD-10-CM | POA: Insufficient documentation

## 2019-01-27 DIAGNOSIS — F419 Anxiety disorder, unspecified: Secondary | ICD-10-CM | POA: Diagnosis not present

## 2019-01-27 DIAGNOSIS — Z800 Family history of malignant neoplasm of digestive organs: Secondary | ICD-10-CM

## 2019-01-27 HISTORY — PX: COLONOSCOPY WITH PROPOFOL: SHX5780

## 2019-01-27 LAB — POCT PREGNANCY, URINE: Preg Test, Ur: NEGATIVE

## 2019-01-27 SURGERY — COLONOSCOPY WITH PROPOFOL
Anesthesia: General

## 2019-01-27 MED ORDER — PROPOFOL 500 MG/50ML IV EMUL
INTRAVENOUS | Status: AC
  Filled 2019-01-27: qty 50

## 2019-01-27 MED ORDER — MIDAZOLAM HCL 2 MG/2ML IJ SOLN
INTRAMUSCULAR | Status: AC
  Filled 2019-01-27: qty 2

## 2019-01-27 MED ORDER — MIDAZOLAM HCL 2 MG/2ML IJ SOLN
INTRAMUSCULAR | Status: DC | PRN
  Administered 2019-01-27: 2 mg via INTRAVENOUS

## 2019-01-27 MED ORDER — SODIUM CHLORIDE 0.9 % IV SOLN
INTRAVENOUS | Status: DC
  Administered 2019-01-27: 1000 mL via INTRAVENOUS

## 2019-01-27 MED ORDER — PROPOFOL 10 MG/ML IV BOLUS
INTRAVENOUS | Status: DC | PRN
  Administered 2019-01-27: 50 mg via INTRAVENOUS

## 2019-01-27 MED ORDER — PROPOFOL 10 MG/ML IV BOLUS
INTRAVENOUS | Status: AC
  Filled 2019-01-27: qty 20

## 2019-01-27 MED ORDER — PHENYLEPHRINE HCL (PRESSORS) 10 MG/ML IV SOLN
INTRAVENOUS | Status: DC | PRN
  Administered 2019-01-27 (×2): 100 ug via INTRAVENOUS

## 2019-01-27 MED ORDER — LIDOCAINE HCL (CARDIAC) PF 100 MG/5ML IV SOSY
PREFILLED_SYRINGE | INTRAVENOUS | Status: DC | PRN
  Administered 2019-01-27: 50 mg via INTRAVENOUS

## 2019-01-27 MED ORDER — PROPOFOL 500 MG/50ML IV EMUL
INTRAVENOUS | Status: DC | PRN
  Administered 2019-01-27: 200 ug/kg/min via INTRAVENOUS

## 2019-01-27 NOTE — Op Note (Signed)
[facility] Gastroenterology Patient Name: Angela Wu Procedure Date: 01/27/2019 9:43 AM MRN: [id number] Account #: [id number] Date of Birth: 1965/12/15 Admit Type: Outpatient Age: 53 Room: [facility] ENDO ROOM 3 Gender: Female Note Status: Finalized Procedure:            Colonoscopy Indications:          Screening in patient at increased risk: Family history                        of 1st-degree relative with colorectal cancer Providers:            Varnita B. Bonna Gains MD, MD Referring MD:         Raiford Simmonds. Gayla Medicus CNM, CNM (Referring MD) Medicines:            Monitored Anesthesia Care Complications:        No immediate complications. Procedure:            Pre-Anesthesia Assessment:                       - Prior to the procedure, a History and Physical was                        performed, and patient medications, allergies and                        sensitivities were reviewed. The patient's tolerance of                        previous anesthesia was reviewed.                       - The risks and benefits of the procedure and the                        sedation options and risks were discussed with the                        patient. All questions were answered and informed                        consent was obtained.                       - Patient identification and proposed procedure were                        verified prior to the procedure by the physician, the                        nurse, the anesthetist and the technician. The                        procedure was verified in the pre-procedure area in the                        procedure room in the endoscopy suite.                       - ASA Grade Assessment: II - A patient with mild  systemic disease.                       - After reviewing the risks and benefits, the patient                        was deemed in satisfactory condition to undergo the                         procedure.                       After obtaining informed consent, the colonoscope was                        passed under direct vision. Throughout the procedure,                        the patient's blood pressure, pulse, and oxygen                        saturations were monitored continuously. The                        Colonoscope was introduced through the anus and                        advanced to the the cecum, identified by appendiceal                        orifice and ileocecal valve. The colonoscopy was                        performed with ease. The patient tolerated the                        procedure well. The quality of the bowel preparation                        was poor except the rectum was fair, the sigmoid colon                        was fair, the descending colon was fair and the                        transverse colon was fair. Findings:      The perianal and digital rectal examinations were normal.      The rectum, sigmoid colon, descending colon, transverse colon, ascending       colon and cecum appeared normal.      The retroflexed view of the distal rectum and anal verge was normal and       showed no anal or rectal abnormalities. Impression:           - The rectum, sigmoid colon, descending colon,                        transverse colon, ascending colon and cecum are normal.                       - The distal rectum and anal verge are normal on  retroflexion view.                       - No specimens collected. Recommendation:       - Discharge patient to home.                       - Resume previous diet.                       - Continue present medications.                       - Repeat colonoscopy in 6-12 months, with 2 day prep,                        for screening purposes.                       - Return to primary care physician as previously                        scheduled.                       - The findings and  recommendations were discussed with                        the patient.                       - The findings and recommendations were discussed with                        the patient's family. Procedure Code(s):    --- Professional ---                       A4166, Colorectal cancer screening; colonoscopy on                        individual at high risk Diagnosis Code(s):    --- Professional ---                       Z80.0, Family history of malignant neoplasm of                        digestive organs CPT copyright 2019 American Medical Association. All rights reserved. The codes documented in this report are preliminary and upon coder review may  be revised to meet current compliance requirements.  [name], MD Margretta Sidle B. Bonna Gains MD, MD 01/27/2019 10:38:36 AM This report has been signed electronically. Number of Addenda: 0 Note Initiated On: 01/27/2019 9:43 AM Scope Withdrawal Time: 0 hours 10 minutes 48 seconds  Total Procedure Duration: 0 hours 26 minutes 28 seconds       [facility]

## 2019-01-27 NOTE — Transfer of Care (Signed)
Immediate Anesthesia Transfer of Care Note  Patient: TAYTEM GHATTAS  Procedure(s) Performed: COLONOSCOPY WITH PROPOFOL (N/A )  Patient Location: PACU  Anesthesia Type:General  Level of Consciousness: awake, alert  and oriented  Airway & Oxygen Therapy: Patient Spontanous Breathing and Patient connected to nasal cannula oxygen  Post-op Assessment: Report given to RN and Post -op Vital signs reviewed and stable  Post vital signs: Reviewed and stable  Last Vitals:  Vitals Value Taken Time  BP 121/75 01/27/19 1029  Temp 36.2 C 01/27/19 1029  Pulse 79 01/27/19 1029  Resp 21 01/27/19 1029  SpO2      Last Pain:  Vitals:   01/27/19 1029  TempSrc: Tympanic  PainSc: 0-No pain         Complications: No apparent anesthesia complications

## 2019-01-27 NOTE — Anesthesia Postprocedure Evaluation (Signed)
Anesthesia Post Note  Patient: Angela Wu  Procedure(s) Performed: COLONOSCOPY WITH PROPOFOL (N/A )  Patient location during evaluation: Endoscopy Anesthesia Type: General Level of consciousness: awake and alert and oriented Pain management: pain level controlled Vital Signs Assessment: post-procedure vital signs reviewed and stable Respiratory status: spontaneous breathing Cardiovascular status: blood pressure returned to baseline Anesthetic complications: no     Last Vitals:  Vitals:   01/27/19 1039 01/27/19 1049  BP: 115/72 107/60  Pulse: 68 71  Resp: (!) 22 (!) 21  Temp:    SpO2: 100% 99%    Last Pain:  Vitals:   01/27/19 1049  TempSrc:   PainSc: 0-No pain                 Kerry-Anne Mezo

## 2019-01-27 NOTE — Anesthesia Preprocedure Evaluation (Addendum)
Anesthesia Evaluation  Patient identified by MRN, date of birth, ID band Patient awake    Reviewed: Allergy & Precautions, NPO status , Patient's Chart, lab work & pertinent test results  Airway Mallampati: III  TM Distance: <3 FB     Dental  (+) Teeth Intact   Pulmonary neg pulmonary ROS,    Pulmonary exam normal        Cardiovascular negative cardio ROS Normal cardiovascular exam     Neuro/Psych Anxiety negative neurological ROS     GI/Hepatic Neg liver ROS,   Endo/Other  negative endocrine ROS  Renal/GU negative Renal ROS  Female GU complaint     Musculoskeletal  (+) Arthritis ,   Abdominal Normal abdominal exam  (+)   Peds negative pediatric ROS (+)  Hematology negative hematology ROS (+)   Anesthesia Other Findings Past Medical History: No date: Anxiety No date: Arthritis     Comment:  hands No date: Hyperlipidemia No date: Irregular bleeding No date: Scoliosis     Comment:  Dx as child. no current issues No date: Sleep disturbance No date: Vertigo     Comment:  no episodes approx 6 mos No date: Vitamin D deficiency  Reproductive/Obstetrics                             Anesthesia Physical  Anesthesia Plan  ASA: II  Anesthesia Plan: General   Post-op Pain Management:    Induction: Intravenous  PONV Risk Score and Plan:   Airway Management Planned: Nasal Cannula  Additional Equipment:   Intra-op Plan:   Post-operative Plan:   Informed Consent: I have reviewed the patients History and Physical, chart, labs and discussed the procedure including the risks, benefits and alternatives for the proposed anesthesia with the patient or authorized representative who has indicated his/her understanding and acceptance.     Dental advisory given  Plan Discussed with: CRNA and Surgeon  Anesthesia Plan Comments:         Anesthesia Quick Evaluation  

## 2019-01-27 NOTE — Anesthesia Post-op Follow-up Note (Signed)
Anesthesia QCDR form completed.        

## 2019-01-27 NOTE — H&P (Signed)
[name], MD [address] [place], [place], [place], [address] [id number], [place], [place], [place], [address] Phone: [phone]  Fax: [phone]  Primary Care Physician:  Joylene Igo, CNM   Pre-Procedure History & Physical: HPI:  Angela Wu is a 53 y.o. female is here for a colonoscopy.   Past Medical History:  Diagnosis Date  . Anxiety   . Arthritis    hands  . Hyperlipidemia   . Irregular bleeding   . Scoliosis    Dx as child. no current issues  . Sleep disturbance   . Vertigo    no episodes approx 6 mos  . Vitamin D deficiency     Past Surgical History:  Procedure Laterality Date  . CESAREAN SECTION    . KNEE SURGERY Left    fractured knee cap    Prior to Admission medications   Medication Sig Start Date End Date Taking? Authorizing Provider  fluticasone (FLONASE) 50 MCG/ACT nasal spray Place into the nose. 07/16/18  Yes [provider]  ibuprofen (ADVIL,MOTRIN) 800 MG tablet Take 1 tablet (800 mg total) by mouth every 8 (eight) hours as needed. 01/06/16  Yes Triplett, Cari B, FNP  norethindrone-ethinyl estradiol-iron (MICROGESTIN FE 1.5/30) 1.5-30 MG-MCG tablet Take 1 tablet by mouth daily. 12/27/18  Yes Philip Aspen, CNM  sertraline (ZOLOFT) 50 MG tablet Take 1 tablet (50 mg total) by mouth daily. Take 1/2 tablet Twice a day 12/27/18  Yes Philip Aspen, CNM  Vitamin D, Ergocalciferol, (DRISDOL) 50000 units CAPS capsule Take 1 capsule (50,000 Units total) by mouth every 7 (seven) days. 04/27/18  Yes Philip Aspen, CNM  atorvastatin (LIPITOR) 10 MG tablet Take 1 tablet (10 mg total) by mouth daily. 04/27/18 07/26/18  Philip Aspen, CNM    Allergies as of 01/11/2019  . (No Known Allergies)    Family History  Problem Relation Age of Onset  . Heart disease Father   . Cancer Mother     Social History   Socioeconomic History  . Marital status: Divorced    Spouse name: Not on file  . Number of children: Not on file   . Years of education: Not on file  . Highest education level: Not on file  Occupational History  . Not on file  Social Needs  . Financial resource strain: Not on file  . Food insecurity    Worry: Not on file    Inability: Not on file  . Transportation needs    Medical: Not on file    Non-medical: Not on file  Tobacco Use  . Smoking status: Never Smoker  . Smokeless tobacco: Never Used  Substance and Sexual Activity  . Alcohol use: Yes    Comment: may have a drink every other month  . Drug use: No  . Sexual activity: Not Currently    Birth control/protection: Pill  Lifestyle  . Physical activity    Days per week: Not on file    Minutes per session: Not on file  . Stress: Not on file  Relationships  . Social Herbalist on phone: Not on file    Gets together: Not on file    Attends religious service: Not on file    Active member of club or organization: Not on file    Attends meetings of clubs or organizations: Not on file    Relationship status: Not on file  . Intimate partner violence    Fear of current or ex partner: Not  on file    Emotionally abused: Not on file    Physically abused: Not on file    Forced sexual activity: Not on file  Other Topics Concern  . Not on file  Social History Narrative  . Not on file    Review of Systems: See HPI, otherwise negative ROS  Physical Exam: BP 128/80   Pulse 83   Temp 98.6 F (37 C) (Oral)   Resp 16   Ht 5\' 5"  (1.651 m)   Wt 94.3 kg   SpO2 100%   BMI 34.61 kg/m  General:   Alert,  pleasant and cooperative in NAD Head:  Normocephalic and atraumatic. Neck:  Supple; no masses or thyromegaly. Lungs:  Clear throughout to auscultation, normal respiratory effort.    Heart:  +S1, +S2, Regular rate and rhythm, No edema. Abdomen:  Soft, nontender and nondistended. Normal bowel sounds, without guarding, and without rebound.   Neurologic:  Alert and  oriented x4;  grossly normal neurologically.  Impression/Plan:  [name] is here for a colonoscopy to be performed for high risk screening. Mother with Stage IV colon cancer.   Risks, benefits, limitations, and alternatives regarding  colonoscopy have been reviewed with the patient.  Questions have been answered.  All parties agreeable.   Virgel Manifold, MD  01/27/2019, 9:13 AM

## 2019-01-28 ENCOUNTER — Encounter: Payer: Self-pay | Admitting: Gastroenterology

## 2019-03-07 ENCOUNTER — Other Ambulatory Visit: Payer: Self-pay | Admitting: Certified Nurse Midwife

## 2019-03-24 ENCOUNTER — Telehealth: Payer: Self-pay | Admitting: Certified Nurse Midwife

## 2019-03-24 NOTE — Telephone Encounter (Signed)
The patient called and stated that she needs to speak with her nurse in regards to an issue she is having. Please advise.

## 2019-03-28 ENCOUNTER — Other Ambulatory Visit: Payer: Self-pay | Admitting: Certified Nurse Midwife

## 2019-03-28 MED ORDER — MINOXIDIL 5 % EX FOAM: 1.0000 "application " | Freq: Every day | CUTANEOUS | 1 refills | Status: DC

## 2019-03-28 NOTE — Progress Notes (Signed)
Orders placed for Minoxidil for hair loss.   Philip Aspen, CNM

## 2019-04-26 ENCOUNTER — Other Ambulatory Visit: Payer: Self-pay

## 2019-04-26 ENCOUNTER — Other Ambulatory Visit: Payer: Self-pay | Admitting: Certified Nurse Midwife

## 2019-04-26 ENCOUNTER — Telehealth: Payer: Self-pay

## 2019-04-26 NOTE — Telephone Encounter (Signed)
mychart message sent to patient- 1 month refill sent on lipitor and patient was asked to make an annual exam appointment.

## 2019-06-21 ENCOUNTER — Ambulatory Visit
Admission: RE | Admit: 2019-06-21 | Discharge: 2019-06-21 | Disposition: A | Discharge status: Home / Self Care | Payer: BC Managed Care – PPO | Source: Ambulatory Visit | Attending: Certified Nurse Midwife | Admitting: Certified Nurse Midwife

## 2019-06-21 DIAGNOSIS — Z1239 Encounter for other screening for malignant neoplasm of breast: Secondary | ICD-10-CM

## 2019-06-21 DIAGNOSIS — Z1231 Encounter for screening mammogram for malignant neoplasm of breast: Secondary | ICD-10-CM | POA: Diagnosis not present

## 2019-07-28 ENCOUNTER — Other Ambulatory Visit
Admission: RE | Admit: 2019-07-28 | Discharge: 2019-07-28 | Disposition: A | Discharge status: Home / Self Care | Payer: BC Managed Care – PPO | Source: Ambulatory Visit | Attending: Student | Admitting: Student

## 2019-07-28 DIAGNOSIS — R0602 Shortness of breath: Secondary | ICD-10-CM | POA: Diagnosis not present

## 2019-07-28 DIAGNOSIS — R42 Dizziness and giddiness: Secondary | ICD-10-CM | POA: Insufficient documentation

## 2019-07-28 LAB — FIBRIN DERIVATIVES D-DIMER (ARMC ONLY): Fibrin derivatives D-dimer (ARMC): 240.57 ng/mL (FEU) (ref 0.00–499.00)

## 2019-08-01 ENCOUNTER — Encounter: Payer: BC Managed Care – PPO | Admitting: Certified Nurse Midwife

## 2020-01-13 ENCOUNTER — Other Ambulatory Visit (HOSPITAL_COMMUNITY): Payer: Self-pay | Admitting: Physician Assistant

## 2020-01-13 ENCOUNTER — Other Ambulatory Visit: Payer: Self-pay | Admitting: Physician Assistant

## 2020-01-13 DIAGNOSIS — H9202 Otalgia, left ear: Secondary | ICD-10-CM

## 2020-01-24 ENCOUNTER — Ambulatory Visit: Admission: RE | Admit: 2020-01-24 | Payer: BC Managed Care – PPO | Source: Ambulatory Visit

## 2020-02-24 ENCOUNTER — Other Ambulatory Visit: Payer: Self-pay | Admitting: Certified Nurse Midwife

## 2020-02-27 ENCOUNTER — Other Ambulatory Visit: Payer: Self-pay

## 2020-02-27 ENCOUNTER — Ambulatory Visit
Admission: RE | Admit: 2020-02-27 | Discharge: 2020-02-27 | Disposition: A | Discharge status: Home / Self Care | Payer: BC Managed Care – PPO | Source: Ambulatory Visit | Attending: Physician Assistant | Admitting: Physician Assistant

## 2020-02-27 DIAGNOSIS — H9202 Otalgia, left ear: Secondary | ICD-10-CM | POA: Diagnosis not present

## 2020-02-27 MED ORDER — IOHEXOL 300 MG/ML  SOLN
75.0000 mL | Freq: Once | INTRAMUSCULAR | Status: AC | PRN
  Administered 2020-02-27: 75 mL via INTRAVENOUS

## 2020-03-01 ENCOUNTER — Other Ambulatory Visit: Payer: Self-pay | Admitting: Certified Nurse Midwife

## 2020-03-01 DIAGNOSIS — Z3041 Encounter for surveillance of contraceptive pills: Secondary | ICD-10-CM

## 2020-03-04 ENCOUNTER — Other Ambulatory Visit: Payer: Self-pay | Admitting: Certified Nurse Midwife

## 2020-03-08 ENCOUNTER — Encounter: Payer: Self-pay | Admitting: *Deleted

## 2020-03-09 ENCOUNTER — Other Ambulatory Visit: Payer: Self-pay

## 2020-03-09 ENCOUNTER — Encounter: Payer: Self-pay | Admitting: Neurology

## 2020-03-09 ENCOUNTER — Ambulatory Visit (INDEPENDENT_AMBULATORY_CARE_PROVIDER_SITE_OTHER): Payer: BC Managed Care – PPO | Admitting: Neurology

## 2020-03-09 DIAGNOSIS — F5104 Psychophysiologic insomnia: Secondary | ICD-10-CM

## 2020-03-09 DIAGNOSIS — G43019 Migraine without aura, intractable, without status migrainosus: Secondary | ICD-10-CM

## 2020-03-09 HISTORY — DX: Psychophysiologic insomnia: F51.04

## 2020-03-09 HISTORY — DX: Migraine without aura, intractable, without status migrainosus: G43.019

## 2020-03-09 MED ORDER — RIZATRIPTAN BENZOATE 10 MG PO TABS: 10.0000 mg | ORAL_TABLET | Freq: Three times a day (TID) | ORAL | 3 refills | Status: DC | PRN

## 2020-03-09 MED ORDER — NORTRIPTYLINE HCL 10 MG PO CAPS: ORAL_CAPSULE | ORAL | 3 refills | Status: DC

## 2020-03-09 NOTE — Progress Notes (Signed)
Reason for visit: Chronic migraine headache  Referring physician: Dr. Molly Maduro is a 54 y.o. female  History of present illness:  Angela Wu is a 54 year old right-handed white female with a history of chronic migraine headache.  She has a very strong family history of migraine with her father and son and a daughter also affected with headache.  She has had intermittent headaches throughout her adult life, but over the last for 5 years they have become more frequent, almost daily in nature.  She had the Covid virus infection in November 2020, since that time her headaches have been more severe and a bit more frequent.  The patient states that her headaches are frontal in nature, she thought she may have sinus disease and she was seen by an ENT physician and was told she did not have sinus issues.  The patient reports that she has some discomfort behind the eyes, she will have some generalization of the headache and associated neck and shoulder discomfort.  She has noted that looking at a computer screen may worsen the headache, and she has noted that weather changes may also do the same.  She takes ibuprofen if needed for the headache, she has never been on a daily prophylactic medication.  She denies any numbness or weakness of extremities, she does note some slight gait instability but reports this has been present since she fractured her left patella.  She has chronic problems with insomnia, takes her several hours to get to sleep at night.  She denies issues controlling the bowels or the bladder, but she has had more frequent bowel movements since she had the Covid infection in November 2020.  She has no visual complaints with the headache, she does have photophobia and phonophobia, she denies nausea or vomiting.  She comes to the office today for an evaluation.   Past Medical History:  Diagnosis Date  . Anxiety   . Arthritis    hands  . Atypical face pain   . Hyperlipidemia     . Irregular bleeding   . Scoliosis    Dx as child. no current issues  . Sleep disturbance   . Vertigo    no episodes approx 6 mos  . Vitamin D deficiency     Past Surgical History:  Procedure Laterality Date  . CESAREAN SECTION    . COLONOSCOPY WITH PROPOFOL N/A 01/27/2019   Procedure: COLONOSCOPY WITH PROPOFOL;  Surgeon: Virgel Manifold, MD;  Location: [facility] ENDOSCOPY;  Service: Endoscopy;  Laterality: N/A;  . KNEE SURGERY Left    fractured knee cap    Family History  Problem Relation Age of Onset  . Heart disease Father   . Heart attack Father   . Cancer Mother   . Breast cancer Neg Hx     Social history:  reports that she has never smoked. She has never used smokeless tobacco. She reports current alcohol use. She reports that she does not use drugs.  Medications:  Prior to Admission medications   Medication Sig Start Date End Date Taking? Authorizing Provider  fluticasone (FLONASE) 50 MCG/ACT nasal spray Place into the nose. 07/16/18  Yes [provider]  ibuprofen (ADVIL,MOTRIN) 800 MG tablet Take 1 tablet (800 mg total) by mouth every 8 (eight) hours as needed. 01/06/16  Yes Triplett, Cari B, FNP  JUNEL FE 1.5/30 1.5-30 MG-MCG tablet TAKE 1 TABLET BY MOUTH EVERY DAY 03/01/20  Yes Philip Aspen, CNM  pantoprazole (PROTONIX) 20  MG tablet Take 20 mg by mouth 2 (two) times daily.   Yes [provider]  sertraline (ZOLOFT) 50 MG tablet TAKE 1/2 TABLET BY MOUTH TWICE A DAY 03/05/20  Yes Philip Aspen, CNM  Vitamin D, Ergocalciferol, (DRISDOL) 50000 units CAPS capsule Take 1 capsule (50,000 Units total) by mouth every 7 (seven) days. 04/27/18  Yes Philip Aspen, CNM  atorvastatin (LIPITOR) 10 MG tablet TAKE 1 TABLET BY MOUTH ONCE A DAY Patient not taking: Reported on 03/09/2020 04/26/19   Philip Aspen, CNM     No Known Allergies  ROS:  Out of a complete 14 system review of symptoms, the patient complains only of the following symptoms, and all other  reviewed systems are negative.  Headache Frequent bowel movements Insomnia  Blood pressure 108/62, pulse 86, height 5\' 5"  (1.651 m), weight 224 lb 2 oz (101.7 kg), SpO2 97 %.  Physical Exam  General: The patient is alert and cooperative at the time of the examination.  The patient is markedly obese.  Eyes: Pupils are equal, round, and reactive to light. Discs are flat bilaterally.  Neck: The neck is supple, no carotid bruits are noted.  Respiratory: The respiratory examination is clear.  Cardiovascular: The cardiovascular examination reveals a regular rate and rhythm, no obvious murmurs or rubs are noted.  Skin: Extremities are without significant edema.  Neurologic Exam  Mental status: The patient is alert and oriented x 3 at the time of the examination. The patient has apparent normal recent and remote memory, with an apparently normal attention span and concentration ability.  Cranial nerves: Facial symmetry is present. There is good sensation of the face to pinprick and soft touch bilaterally. The strength of the facial muscles and the muscles to head turning and shoulder shrug are normal bilaterally. Speech is well enunciated, no aphasia or dysarthria is noted. Extraocular movements are full. Visual fields are full. The tongue is midline, and the patient has symmetric elevation of the soft palate. No obvious hearing deficits are noted.  The patient has intermittent facial tic involving the right face.  Motor: The motor testing reveals 5 over 5 strength of all 4 extremities. Good symmetric motor tone is noted throughout.  Sensory: Sensory testing is intact to pinprick, soft touch, vibration sensation, and position sense on all 4 extremities, with exception that the vibration sensation is somewhat decreased on the right hand and right foot. No evidence of extinction is noted.  Coordination: Cerebellar testing reveals good finger-nose-finger and heel-to-shin bilaterally.  Gait and  station: Gait is normal. Tandem gait is normal. Romberg is negative. No drift is seen.  Reflexes: Deep tendon reflexes are symmetric and normal bilaterally. Toes are downgoing bilaterally.   Assessment/Plan:  1.  Intractable common migraine  2.  Chronic insomnia  The patient will be placed on nortriptyline to help her sleep better at night and help the headache.  She was given a prescription for Maxalt to take if needed, she will continue to take ibuprofen for the headache.  She will follow-up here in 3 months.  She will call for any dose adjustments of her medication.  Angela Alexanders MD 03/09/2020 10:21 AM  [facility] [address] [place], [place] [address]  Phone [phone] Fax [phone]

## 2020-03-09 NOTE — Patient Instructions (Signed)
We will start nortriptyline for the headache, take maxalt if needed.  Pamelor (nortriptyline) is an antidepressant medication that has many uses that may include headache, whiplash injuries, or for peripheral neuropathy pain. Side effects may include drowsiness, dry mouth, blurred vision, or constipation. As with any antidepressant medication, worsening depression may occur. If you had any significant side effects, please call our office. The full effects of this medication may take 7-10 days after starting the drug, or going up on the dose.

## 2020-03-14 DIAGNOSIS — Z800 Family history of malignant neoplasm of digestive organs: Secondary | ICD-10-CM

## 2020-03-23 ENCOUNTER — Telehealth (INDEPENDENT_AMBULATORY_CARE_PROVIDER_SITE_OTHER): Payer: Self-pay | Admitting: Gastroenterology

## 2020-03-23 ENCOUNTER — Other Ambulatory Visit: Payer: Self-pay

## 2020-03-23 DIAGNOSIS — Z1211 Encounter for screening for malignant neoplasm of colon: Secondary | ICD-10-CM

## 2020-03-23 DIAGNOSIS — Z800 Family history of malignant neoplasm of digestive organs: Secondary | ICD-10-CM

## 2020-03-23 MED ORDER — PEG 3350-KCL-NA BICARB-NACL 420 G PO SOLR: 4000.0000 mL | Freq: Once | ORAL | 0 refills | Status: AC

## 2020-03-23 NOTE — Progress Notes (Signed)
Gastroenterology Pre-Procedure Review  Request Date: 04/20/20 Requesting Physician: Dr. Bonna Gains  PATIENT REVIEW QUESTIONS: The patient responded to the following health history questions as indicated:    1. Are you having any GI issues? yes (since having COVID in Nov 2020 pt states she has experienced strange stool, bowel movement frequency, strange consistency and color) 2. Do you have a personal history of Polyps? no 3. Do you have a family history of Colon Cancer or Polyps? yes (mother had colorectal cancer) 4. Diabetes Mellitus? no 5. Joint replacements in the past 12 months?no 6. Major health problems in the past 3 months?no 7. Any artificial heart valves, MVP, or defibrillator?no    MEDICATIONS & ALLERGIES:    Patient reports the following regarding taking any anticoagulation/antiplatelet therapy:   Plavix, Coumadin, Eliquis, Xarelto, Lovenox, Pradaxa, Brilinta, or Effient? no Aspirin? no  Patient confirms/reports the following medications:  Current Outpatient Medications  Medication Sig Dispense Refill  . atorvastatin (LIPITOR) 10 MG tablet TAKE 1 TABLET BY MOUTH ONCE A DAY 30 tablet 0  . ibuprofen (ADVIL,MOTRIN) 800 MG tablet Take 1 tablet (800 mg total) by mouth every 8 (eight) hours as needed. 30 tablet 0  . JUNEL FE 1.5/30 1.5-30 MG-MCG tablet TAKE 1 TABLET BY MOUTH EVERY DAY 84 tablet 0  . nortriptyline (PAMELOR) 10 MG capsule Take one capsule at night for one week, then take 2 capsules at night for one week, then take 3 capsules at night 90 capsule 3  . pantoprazole (PROTONIX) 20 MG tablet Take 20 mg by mouth 2 (two) times daily.    . rizatriptan (MAXALT) 10 MG tablet Take 1 tablet (10 mg total) by mouth 3 (three) times daily as needed for migraine. 10 tablet 3  . sertraline (ZOLOFT) 50 MG tablet TAKE 1/2 TABLET BY MOUTH TWICE A DAY 90 tablet 0  . Vitamin D, Ergocalciferol, (DRISDOL) 50000 units CAPS capsule Take 1 capsule (50,000 Units total) by mouth every 7 (seven)  days. 84 capsule 0  . fluticasone (FLONASE) 50 MCG/ACT nasal spray Place into the nose. (Patient not taking: Reported on 03/23/2020)     No current facility-administered medications for this visit.    Patient confirms/reports the following allergies:  No Known Allergies  No orders of the defined types were placed in this encounter.   AUTHORIZATION INFORMATION Primary Insurance: 1D#: Group #:  Secondary Insurance: 1D#: Group #:  SCHEDULE INFORMATION: Date: 04/20/20 Time: Location:[facility]

## 2020-04-10 ENCOUNTER — Other Ambulatory Visit: Payer: Self-pay | Admitting: Neurology

## 2020-04-17 ENCOUNTER — Other Ambulatory Visit: Payer: Self-pay

## 2020-04-18 ENCOUNTER — Other Ambulatory Visit: Admission: RE | Admit: 2020-04-18 | Payer: BC Managed Care – PPO | Source: Ambulatory Visit

## 2020-04-20 ENCOUNTER — Encounter: Admission: RE | Payer: Self-pay | Source: Home / Self Care

## 2020-04-20 ENCOUNTER — Ambulatory Visit
Admission: RE | Admit: 2020-04-20 | Payer: BC Managed Care – PPO | Source: Home / Self Care | Admitting: Gastroenterology

## 2020-04-20 SURGERY — COLONOSCOPY WITH PROPOFOL
Anesthesia: General

## 2020-05-01 ENCOUNTER — Other Ambulatory Visit: Payer: Self-pay

## 2020-05-01 ENCOUNTER — Encounter: Payer: Self-pay | Admitting: Certified Nurse Midwife

## 2020-05-01 ENCOUNTER — Ambulatory Visit (INDEPENDENT_AMBULATORY_CARE_PROVIDER_SITE_OTHER): Payer: BC Managed Care – PPO | Admitting: Certified Nurse Midwife

## 2020-05-01 VITALS — BP 115/80 | HR 96 | Ht 65.0 in | Wt 227.2 lb

## 2020-05-01 DIAGNOSIS — F419 Anxiety disorder, unspecified: Secondary | ICD-10-CM

## 2020-05-01 DIAGNOSIS — Z3041 Encounter for surveillance of contraceptive pills: Secondary | ICD-10-CM | POA: Diagnosis not present

## 2020-05-01 DIAGNOSIS — Z1231 Encounter for screening mammogram for malignant neoplasm of breast: Secondary | ICD-10-CM

## 2020-05-01 DIAGNOSIS — Z01419 Encounter for gynecological examination (general) (routine) without abnormal findings: Secondary | ICD-10-CM | POA: Diagnosis not present

## 2020-05-01 MED ORDER — NORETHIN ACE-ETH ESTRAD-FE 1.5-30 MG-MCG PO TABS: 1.0000 | ORAL_TABLET | Freq: Every day | ORAL | 3 refills | Status: DC

## 2020-05-01 MED ORDER — VITAMIN D (ERGOCALCIFEROL) 1.25 MG (50000 UNIT) PO CAPS
50000.0000 [IU] | ORAL_CAPSULE | ORAL | 3 refills | Status: DC
Start: 2020-05-01 — End: 2021-06-17

## 2020-05-01 NOTE — Patient Instructions (Signed)

## 2020-05-01 NOTE — Progress Notes (Signed)
GYNECOLOGY ANNUAL PREVENTATIVE CARE ENCOUNTER NOTE  History:     Angela Wu is a 54 y.o. [id number] female here for a routine annual gynecologic exam.  Current complaints: mother passed away 2 wks ago, she has been very anxious and is asking about medication for anxiety attacks. .   Denies abnormal vaginal bleeding, discharge, pelvic pain, problems with intercourse or other gynecologic concerns.     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 Contraception: OCP (estrogen/progesterone) Last Pap: 05/13/2016. Results were: normal with negative HPV Last mammogram: 06/21/19. Results were: normal   Upstream - 05/01/20 0909      Pregnancy Intention Screening   Does the patient want to become pregnant in the next year? No    Does the patient's partner want to become pregnant in the next year? No    Would the patient like to discuss contraceptive options today? No      Contraception Wrap Up   Current Method Oral Contraceptive    Contraception Counseling Provided No          The pregnancy intention screening data noted above was reviewed. Potential methods of contraception were discussed. The patient elected to proceed with Oral Contraceptive.   Obstetric History OB History  Gravida Para Term Preterm AB Living  2 2 2     2   SAB TAB Ectopic Multiple Live Births          2    # Outcome Date GA Lbr Len/2nd Weight Sex Delivery Anes PTL Lv  2 Term 2002 [redacted]w[redacted]d  9 lb 2 oz (4.139 kg) M VBAC  N LIV  1 Term 1999 [redacted]w[redacted]d  8 lb 7 oz (3.827 kg) F CS-Unspec  N LIV    Past Medical History:  Diagnosis Date  . Anxiety   . Arthritis    hands  . Atypical face pain   . Chronic insomnia 03/09/2020  . Common migraine with intractable migraine 03/09/2020  . Hyperlipidemia   . Irregular bleeding   .  Scoliosis    Dx as child. no current issues  . Sleep disturbance   . Vertigo    no episodes approx 6 mos  . Vitamin D deficiency     Past Surgical History:  Procedure Laterality Date  . CESAREAN SECTION    . COLONOSCOPY WITH PROPOFOL N/A 01/27/2019   Procedure: COLONOSCOPY WITH PROPOFOL;  Surgeon: Virgel Manifold, MD;  Location: [facility] ENDOSCOPY;  Service: Endoscopy;  Laterality: N/A;  . KNEE SURGERY Left    fractured knee cap    Current Outpatient Medications on File Prior to Visit  Medication Sig Dispense Refill  . atorvastatin (LIPITOR) 10 MG tablet TAKE 1 TABLET BY MOUTH ONCE A DAY 30 tablet 0  . fluticasone (FLONASE) 50 MCG/ACT nasal spray Place into the nose.     . ibuprofen (ADVIL,MOTRIN) 800 MG tablet Take 1 tablet (800 mg total) by mouth every 8 (eight) hours as needed. 30 tablet 0  . JUNEL FE 1.5/30 1.5-30 MG-MCG tablet TAKE 1 TABLET BY MOUTH EVERY DAY 84 tablet 0  . nortriptyline (PAMELOR) 10 MG capsule TAKE 1 CAPSULE BY MOUTH AT NIGHT FOR 1 WEEK, THEN INCREASE TO 2 CAPSULES AT NIGHT FOR 1 WEEK THEN 3 CAPSULES BY MOUTH FOR 1 WEEK 270 capsule 2  . pantoprazole (PROTONIX) 20 MG tablet Take 20 mg by mouth  2 (two) times daily.    . rizatriptan (MAXALT) 10 MG tablet Take 1 tablet (10 mg total) by mouth 3 (three) times daily as needed for migraine. 10 tablet 3  . sertraline (ZOLOFT) 50 MG tablet TAKE 1/2 TABLET BY MOUTH TWICE A DAY 90 tablet 0  . Vitamin D, Ergocalciferol, (DRISDOL) 50000 units CAPS capsule Take 1 capsule (50,000 Units total) by mouth every 7 (seven) days. 84 capsule 0   No current facility-administered medications on file prior to visit.    No Known Allergies  Social History:  reports that she has never smoked. She has never used smokeless tobacco. She reports current alcohol use. She reports that she does not use drugs.  Family History  Problem Relation Age of Onset  . Heart disease Father   . Heart attack Father   . Cancer Mother   . Breast cancer  Neg Hx     The following portions of the patient's history were reviewed and updated as appropriate: allergies, current medications, past family history, past medical history, past social history, past surgical history and problem list.  Review of Systems Pertinent items noted in HPI and remainder of comprehensive ROS otherwise negative.  Physical Exam:  BP 115/80   Pulse 96   Ht 5\' 5"  (1.651 m)   Wt 227 lb 3 oz (103.1 kg)   LMP 04/24/2020 (Exact Date)   BMI 37.81 kg/m  CONSTITUTIONAL: Well-developed, well-nourished, obese female in no acute distress.  HENT:  Normocephalic, atraumatic, External right and left ear normal. Oropharynx is clear and moist EYES: Conjunctivae and EOM are normal. Pupils are equal, round, and reactive to light. No scleral icterus.  NECK: Normal range of motion, supple, no masses.  Normal thyroid.  SKIN: Skin is warm and dry. No rash noted. Not diaphoretic. No erythema. No pallor. MUSCULOSKELETAL: Normal range of motion. No tenderness.  No cyanosis, clubbing, or edema.  2+ distal pulses. NEUROLOGIC: Alert and oriented to person, place, and time. Normal reflexes, muscle tone coordination.  PSYCHIATRIC: Normal mood and affect. Normal behavior. Normal judgment and thought content. CARDIOVASCULAR: Normal heart rate noted, regular rhythm RESPIRATORY: Clear to auscultation bilaterally. Effort and breath sounds normal, no problems with respiration noted. BREASTS: Symmetric in size. No masses, tenderness, skin changes, nipple drainage, or lymphadenopathy bilaterally.  ABDOMEN: Soft, no distention noted.  No tenderness, rebound or guarding.  PELVIC: Normal appearing external genitalia and urethral meatus; normal appearing vaginal mucosa and cervix.  No abnormal discharge noted.  Pap smear not indicated.  Normal uterine size, no other palpable masses, no uterine or adnexal tenderness.  .   Assessment and Plan:    1. Encounter for screening mammogram for malignant neoplasm  of breast - MM 3D SCREEN BREAST BILATERAL; Future  2. Women's annual routine gynecological examination   Pap:not due until 2022 Mammogram :ordered Labs:had done at [facility]; Last year, was normal with some polyps has repeat this year.  Refills:Vitamin D, OCP Referral:psychartry for anxiety Encouraged exercise for weight loss Routine preventative health maintenance measures emphasized. Please refer to After Visit Summary for other counseling recommendations.      Philip Aspen, CNM [facility]

## 2020-05-08 ENCOUNTER — Telehealth: Payer: Self-pay

## 2020-05-08 ENCOUNTER — Other Ambulatory Visit
Admission: RE | Admit: 2020-05-08 | Discharge: 2020-05-08 | Disposition: A | Discharge status: Home / Self Care | Payer: BC Managed Care – PPO | Source: Ambulatory Visit | Attending: Gastroenterology | Admitting: Gastroenterology

## 2020-05-08 ENCOUNTER — Other Ambulatory Visit: Payer: Self-pay

## 2020-05-08 DIAGNOSIS — Z01812 Encounter for preprocedural laboratory examination: Secondary | ICD-10-CM | POA: Insufficient documentation

## 2020-05-08 DIAGNOSIS — Z20822 Contact with and (suspected) exposure to covid-19: Secondary | ICD-10-CM | POA: Diagnosis not present

## 2020-05-08 LAB — SARS CORONAVIRUS 2 (TAT 6-24 HRS): SARS Coronavirus 2: NEGATIVE

## 2020-05-08 NOTE — Telephone Encounter (Signed)
Returned patients call. Patient had questions regarding her covid test. Questions were answered by [facility] nurse.

## 2020-05-09 ENCOUNTER — Encounter: Payer: Self-pay | Admitting: Gastroenterology

## 2020-05-10 ENCOUNTER — Other Ambulatory Visit: Payer: Self-pay

## 2020-05-10 ENCOUNTER — Encounter: Payer: Self-pay | Admitting: Gastroenterology

## 2020-05-10 ENCOUNTER — Ambulatory Visit: Payer: BC Managed Care – PPO | Admitting: Certified Registered"

## 2020-05-10 ENCOUNTER — Ambulatory Visit
Admission: RE | Admit: 2020-05-10 | Discharge: 2020-05-10 | Disposition: A | Discharge status: Home / Self Care | Payer: BC Managed Care – PPO | Attending: Gastroenterology | Admitting: Gastroenterology

## 2020-05-10 ENCOUNTER — Encounter
Admission: RE | Disposition: A | Discharge status: Home / Self Care | Payer: Self-pay | Source: Home / Self Care | Attending: Gastroenterology

## 2020-05-10 DIAGNOSIS — K6289 Other specified diseases of anus and rectum: Secondary | ICD-10-CM | POA: Diagnosis not present

## 2020-05-10 DIAGNOSIS — Z800 Family history of malignant neoplasm of digestive organs: Secondary | ICD-10-CM | POA: Diagnosis present

## 2020-05-10 DIAGNOSIS — K635 Polyp of colon: Secondary | ICD-10-CM | POA: Diagnosis not present

## 2020-05-10 DIAGNOSIS — Z1211 Encounter for screening for malignant neoplasm of colon: Secondary | ICD-10-CM | POA: Diagnosis not present

## 2020-05-10 DIAGNOSIS — K5730 Diverticulosis of large intestine without perforation or abscess without bleeding: Secondary | ICD-10-CM | POA: Insufficient documentation

## 2020-05-10 DIAGNOSIS — Z793 Long term (current) use of hormonal contraceptives: Secondary | ICD-10-CM | POA: Insufficient documentation

## 2020-05-10 DIAGNOSIS — Z79899 Other long term (current) drug therapy: Secondary | ICD-10-CM | POA: Diagnosis not present

## 2020-05-10 DIAGNOSIS — K644 Residual hemorrhoidal skin tags: Secondary | ICD-10-CM | POA: Diagnosis not present

## 2020-05-10 DIAGNOSIS — D123 Benign neoplasm of transverse colon: Secondary | ICD-10-CM | POA: Insufficient documentation

## 2020-05-10 DIAGNOSIS — Z809 Family history of malignant neoplasm, unspecified: Secondary | ICD-10-CM

## 2020-05-10 HISTORY — PX: COLONOSCOPY WITH PROPOFOL: SHX5780

## 2020-05-10 LAB — POCT PREGNANCY, URINE: Preg Test, Ur: NEGATIVE

## 2020-05-10 SURGERY — COLONOSCOPY WITH PROPOFOL
Anesthesia: General

## 2020-05-10 MED ORDER — PROPOFOL 500 MG/50ML IV EMUL
INTRAVENOUS | Status: DC | PRN
  Administered 2020-05-10 (×6): 20 mg via INTRAVENOUS
  Administered 2020-05-10: 50 mg via INTRAVENOUS

## 2020-05-10 MED ORDER — LIDOCAINE HCL (CARDIAC) PF 100 MG/5ML IV SOSY
PREFILLED_SYRINGE | INTRAVENOUS | Status: DC | PRN
  Administered 2020-05-10: 100 mg via INTRATRACHEAL

## 2020-05-10 MED ORDER — SODIUM CHLORIDE 0.9 % IV SOLN: INTRAVENOUS | Status: DC

## 2020-05-10 MED ORDER — ONDANSETRON HCL 4 MG/2ML IJ SOLN
INTRAMUSCULAR | Status: DC | PRN
  Administered 2020-05-10: 4 mg via INTRAVENOUS

## 2020-05-10 MED ORDER — PROPOFOL 10 MG/ML IV BOLUS
INTRAVENOUS | Status: DC | PRN
  Administered 2020-05-10: 200 ug/kg/min via INTRAVENOUS

## 2020-05-10 MED ORDER — PROPOFOL 500 MG/50ML IV EMUL
INTRAVENOUS | Status: AC
  Filled 2020-05-10: qty 50

## 2020-05-10 NOTE — Anesthesia Postprocedure Evaluation (Signed)
Anesthesia Post Note  Patient: Angela Wu  Procedure(s) Performed: COLONOSCOPY WITH PROPOFOL (N/A )  Patient location during evaluation: Phase II Anesthesia Type: General Level of consciousness: awake Pain management: pain level controlled Vital Signs Assessment: post-procedure vital signs reviewed and stable Respiratory status: spontaneous breathing Cardiovascular status: blood pressure returned to baseline Postop Assessment: no headache and no backache Anesthetic complications: no   No complications documented.   Last Vitals:  Vitals:   05/10/20 0858 05/10/20 0859  BP: 113/62   Pulse:    Resp:    Temp: (!) (P) 36 C 36.6 C  SpO2:      Last Pain:  Vitals:   05/10/20 0858  TempSrc: (P) Temporal  PainSc:                  Dierdre Forth Brei [place]

## 2020-05-10 NOTE — Op Note (Signed)
[facility] Patient Name: Angela Wu Procedure Date: 05/10/2020 7:29 AM MRN: [id number] Account #: [id number] Date of Birth: [birth year] Admit Type: Outpatient Age: 54 Room: [facility] ENDO ROOM 1 Gender: Female Note Status: Finalized Procedure:             Colonoscopy Indications:           Screening in patient at increased risk: Family history                         of 1st-degree relative with colorectal cancer Providers:             Ustin Cruickshank B. Bonna Gains MD, MD Referring MD:          Philip [place] (Referring MD) Medicines:             Monitored Anesthesia Care Complications:         No immediate complications. Procedure:             Pre-Anesthesia Assessment:                        - ASA Grade Assessment: II - A patient with mild                         systemic disease.                        - Prior to the procedure, a History and Physical was                         performed, and patient medications, allergies and                         sensitivities were reviewed. The patient's tolerance                         of previous anesthesia was reviewed.                        - The risks and benefits of the procedure and the                         sedation options and risks were discussed with the                         patient. All questions were answered and informed                         consent was obtained.                        - Patient identification and proposed procedure were                         verified prior to the procedure by the physician, the                         nurse, the anesthesiologist, the anesthetist and the                         technician.  The procedure was verified in the                         procedure room.                        After obtaining informed consent, the colonoscope was                         passed under direct vision. Throughout the procedure,                         the patient's blood  pressure, pulse, and oxygen                         saturations were monitored continuously. The                         Colonoscope was introduced through the anus and                         advanced to the the cecum, identified by appendiceal                         orifice and ileocecal valve. The colonoscopy was                         performed with ease. The patient tolerated the                         procedure well. The quality of the bowel preparation                         was good. Findings:      The perianal exam findings include non-thrombosed external hemorrhoids.      A 4 mm polyp was found in the transverse colon. The polyp was sessile.       The polyp was removed with a jumbo cold forceps. Resection and retrieval       were complete.      Multiple small-mouthed diverticula were found in the entire colon.      The exam was otherwise without abnormality.      The rectum, sigmoid colon, descending colon, transverse colon, ascending       colon and cecum appeared normal.      Anal papilla(e) were hypertrophied.      No additional abnormalities were found on retroflexion. Impression:            - Non-thrombosed external hemorrhoids found on                         perianal exam.                        - One 4 mm polyp in the transverse colon, removed with                         a jumbo cold forceps. Resected and retrieved.                        -  Diverticulosis in the entire examined colon.                        - The examination was otherwise normal.                        - The rectum, sigmoid colon, descending colon,                         transverse colon, ascending colon and cecum are normal.                        - Anal papilla(e) were hypertrophied. Recommendation:        - Discharge patient to home (with escort).                        - Advance diet as tolerated.                        - Continue present medications.                        - Await pathology  results.                        - Repeat colonoscopy in 5 years.                        - The findings and recommendations were discussed with                         the patient.                        - The findings and recommendations were discussed with                         the patient's family.                        - Return to primary care physician as previously                         scheduled.                        - High fiber diet. Procedure Code(s):     --- Professional ---                        [phone], Colonoscopy, flexible; with biopsy, single or                         multiple Diagnosis Code(s):     --- Professional ---                        Z80.0, Family history of malignant neoplasm of                         digestive organs  K63.5, Polyp of colon CPT copyright 2019 American Medical Association. All rights reserved. The codes documented in this report are preliminary and upon coder review may  be revised to meet current compliance requirements.  [name], MD Margretta Sidle B. Bonna Gains MD, MD 05/10/2020 8:57:42 AM This report has been signed electronically. Number of Addenda: 0 Note Initiated On: 05/10/2020 7:29 AM Scope Withdrawal Time: 0 hours 23 minutes 59 seconds  Total Procedure Duration: 0 hours 33 minutes 48 seconds  Estimated Blood Loss:  Estimated blood loss: none.      [facility]

## 2020-05-10 NOTE — Transfer of Care (Signed)
Immediate Anesthesia Transfer of Care Note  Patient: LADASHA SCHNACKENBERG  Procedure(s) Performed: COLONOSCOPY WITH PROPOFOL (N/A )  Patient Location: PACU  Anesthesia Type:General  Level of Consciousness: awake  Airway & Oxygen Therapy: Patient Spontanous Breathing  Post-op Assessment: Report given to RN and Post -op Vital signs reviewed and stable  Post vital signs: Reviewed  Last Vitals:  Vitals Value Taken Time  BP    Temp 36.6 C 05/10/20 0859  Pulse 83 05/10/20 0859  Resp 17 05/10/20 0859  SpO2 99 % 05/10/20 0859  Vitals shown include unvalidated device data.  Last Pain:  Vitals:   05/10/20 0737  TempSrc: Temporal  PainSc: 0-No pain         Complications: No complications documented.

## 2020-05-10 NOTE — H&P (Signed)
[name], MD [address] [place], [place], [place], [address] [address], [place], [place], [place], [address] Phone: [phone]  Fax: [phone]  Primary Care Physician:  Philip [place], CNM   Pre-Procedure History & Physical: HPI:  Angela Wu is a 54 y.o. female is here for a colonoscopy.   Past Medical History:  Diagnosis Date  . Anxiety   . Arthritis    hands  . Atypical face pain   . Chronic insomnia 03/09/2020  . Common migraine with intractable migraine 03/09/2020  . Hyperlipidemia   . Irregular bleeding   . Scoliosis    Dx as child. no current issues  . Sleep disturbance   . Vertigo    no episodes approx 6 mos  . Vitamin D deficiency     Past Surgical History:  Procedure Laterality Date  . CESAREAN SECTION    . COLONOSCOPY WITH PROPOFOL N/A 01/27/2019   Procedure: COLONOSCOPY WITH PROPOFOL;  Surgeon: Virgel Manifold, MD;  Location: [facility] ENDOSCOPY;  Service: Endoscopy;  Laterality: N/A;  . KNEE SURGERY Left    fractured knee cap    Prior to Admission medications   Medication Sig Start Date End Date Taking? Authorizing Provider  atorvastatin (LIPITOR) 10 MG tablet TAKE 1 TABLET BY MOUTH ONCE A DAY 04/26/19  Yes Philip [place], CNM  fluticasone (FLONASE) 50 MCG/ACT nasal spray Place into the nose.  07/16/18  Yes [provider]  ibuprofen (ADVIL,MOTRIN) 800 MG tablet Take 1 tablet (800 mg total) by mouth every 8 (eight) hours as needed. 01/06/16  Yes Triplett, Cari B, FNP  norethindrone-ethinyl estradiol-iron (JUNEL FE 1.5/30) 1.5-30 MG-MCG tablet Take 1 tablet by mouth daily. 05/01/20  Yes Philip [place], CNM  nortriptyline (PAMELOR) 10 MG capsule TAKE 1 CAPSULE BY MOUTH AT NIGHT FOR 1 WEEK, THEN INCREASE TO 2 CAPSULES AT NIGHT FOR 1 WEEK THEN 3 CAPSULES BY MOUTH FOR 1 WEEK 04/10/20  Yes Kathrynn Ducking, MD  pantoprazole (PROTONIX) 20 MG tablet Take 20 mg by mouth 2 (two) times daily.   Yes [provider]  sertraline  (ZOLOFT) 50 MG tablet TAKE 1/2 TABLET BY MOUTH TWICE A DAY 03/05/20  Yes Philip [place], CNM  Vitamin D, Ergocalciferol, (DRISDOL) 1.25 MG (50000 UNIT) CAPS capsule Take 1 capsule (50,000 Units total) by mouth every 7 (seven) days. 05/01/20  Yes Philip [place], CNM  rizatriptan (MAXALT) 10 MG tablet Take 1 tablet (10 mg total) by mouth 3 (three) times daily as needed for migraine. 03/09/20   Kathrynn Ducking, MD    Allergies as of 04/17/2020  . (No Known Allergies)    Family History  Problem Relation Age of Onset  . Heart disease Father   . Heart attack Father   . Cancer Mother   . Breast cancer Neg Hx     Social History   Socioeconomic History  . Marital status: Divorced    Spouse name: Not on file  . Number of children: Not on file  . Years of education: Not on file  . Highest education level: Not on file  Occupational History  . Not on file  Tobacco Use  . Smoking status: Never Smoker  . Smokeless tobacco: Never Used  Vaping Use  . Vaping Use: Never used  Substance and Sexual Activity  . Alcohol use: Yes    Comment: may have a drink every other month  . Drug use: No  . Sexual activity: Not Currently    Birth control/protection: Pill  Other Topics Concern  . Not on file  Social History Narrative  . Not on file   Social Determinants of Health   Financial Resource Strain:   . Difficulty of Paying Living Expenses: Not on file  Food Insecurity:   . Worried About Charity fundraiser in the Last Year: Not on file  . Ran Out of Food in the Last Year: Not on file  Transportation Needs:   . Lack of Transportation (Medical): Not on file  . Lack of Transportation (Non-Medical): Not on file  Physical Activity:   . Days of Exercise per Week: Not on file  . Minutes of Exercise per Session: Not on file  Stress:   . Feeling of Stress : Not on file  Social Connections:   . Frequency of Communication with Friends and Family: Not on file  . Frequency of Social Gatherings  with Friends and Family: Not on file  . Attends Religious Services: Not on file  . Active Member of Clubs or Organizations: Not on file  . Attends Archivist Meetings: Not on file  . Marital Status: Not on file  Intimate Partner Violence:   . Fear of Current or Ex-Partner: Not on file  . Emotionally Abused: Not on file  . Physically Abused: Not on file  . Sexually Abused: Not on file    Review of Systems: See HPI, otherwise negative ROS  Physical Exam: BP 136/86   Pulse 92   Temp 98.2 F (36.8 C) (Temporal)   Resp 16   Ht 5\' 5"  (1.651 m)   Wt 103.1 kg   LMP 04/24/2020 (Exact Date) Comment: Negative pregnancy test 05-10-2020  SpO2 100%   BMI 37.82 kg/m  General:   Alert,  pleasant and cooperative in NAD Head:  Normocephalic and atraumatic. Neck:  Supple; no masses or thyromegaly. Lungs:  Clear throughout to auscultation, normal respiratory effort.    Heart:  +S1, +S2, Regular rate and rhythm, No edema. Abdomen:  Soft, nontender and nondistended. Normal bowel sounds, without guarding, and without rebound.   Neurologic:  Alert and  oriented x4;  grossly normal neurologically.  Impression/Plan: Angela Wu is here for a colonoscopy to be performed for family history of colon cancer Risks, benefits, limitations, and alternatives regarding  colonoscopy have been reviewed with the patient.  Questions have been answered.  All parties agreeable.   Virgel Manifold, MD  05/10/2020, 8:13 AM

## 2020-05-10 NOTE — Anesthesia Preprocedure Evaluation (Signed)
Anesthesia Evaluation  Patient identified by MRN, date of birth, ID band Patient awake    Reviewed: Allergy & Precautions, NPO status , Patient's Chart, lab work & pertinent test results  Airway Mallampati: III  TM Distance: <3 FB     Dental  (+) Teeth Intact   Pulmonary neg pulmonary ROS,    Pulmonary exam normal        Cardiovascular negative cardio ROS Normal cardiovascular exam     Neuro/Psych Anxiety negative neurological ROS     GI/Hepatic Neg liver ROS,   Endo/Other  negative endocrine ROS  Renal/GU negative Renal ROS  Female GU complaint     Musculoskeletal  (+) Arthritis ,   Abdominal Normal abdominal exam  (+)   Peds negative pediatric ROS (+)  Hematology negative hematology ROS (+)   Anesthesia Other Findings Past Medical History: No date: Anxiety No date: Arthritis     Comment:  hands No date: Hyperlipidemia No date: Irregular bleeding No date: Scoliosis     Comment:  Dx as child. no current issues No date: Sleep disturbance No date: Vertigo     Comment:  no episodes approx 6 mos No date: Vitamin D deficiency  Reproductive/Obstetrics                             Anesthesia Physical  Anesthesia Plan  ASA: II  Anesthesia Plan: General   Post-op Pain Management:    Induction: Intravenous  PONV Risk Score and Plan:   Airway Management Planned: Nasal Cannula  Additional Equipment:   Intra-op Plan:   Post-operative Plan:   Informed Consent: I have reviewed the patients History and Physical, chart, labs and discussed the procedure including the risks, benefits and alternatives for the proposed anesthesia with the patient or authorized representative who has indicated his/her understanding and acceptance.     Dental advisory given  Plan Discussed with: CRNA and Surgeon  Anesthesia Plan Comments:         Anesthesia Quick Evaluation

## 2020-05-11 ENCOUNTER — Encounter: Payer: Self-pay | Admitting: Gastroenterology

## 2020-05-11 ENCOUNTER — Telehealth: Payer: Self-pay

## 2020-05-11 LAB — SURGICAL PATHOLOGY

## 2020-05-11 NOTE — Telephone Encounter (Signed)
Returned patients call. Patient stated she is experience some pain and discoloration in the IV site of hand. Informed patient she should contact the Endo unit nurse for advise since procedure was done there. Pt verbalized understanding.

## 2020-05-14 ENCOUNTER — Encounter: Payer: Self-pay | Admitting: Gastroenterology

## 2020-05-29 ENCOUNTER — Telehealth: Payer: Self-pay | Admitting: Neurology

## 2020-05-29 NOTE — Progress Notes (Signed)
Virtual Visit via Video Note  I connected with Angela Wu on 05/30/20 at  8:45 AM EST by a video enabled telemedicine application and verified that I am speaking with the correct person using two identifiers.  Location: Patient: at her home Provider: in the office    I discussed the limitations of evaluation and management by telemedicine and the availability of in person appointments. The patient expressed understanding and agreed to proceed.  History of Present Illness: 05/30/2020 SS: Angela Wu is a 54 year old female with history of chronic migraine headache.  Currently taking nortriptyline 30 mg at bedtime, headaches are 50% better, is tolerating well.  Unclear if benefit with sleep, lifelong insomnia.  Has yet to take Maxalt.  For acute headache, will take ibuprofen or Tylenol with good benefit.  Has currently been dealing with prolonged URI/sinus issues, hard to accurately judge headaches, but overall thinks is improved.  Works from home, does not miss work due to headaches, works for [employer].  Has 2 grown children. Presents today for evaluation via virtual visit  03/09/2020 Dr. Jannifer Franklin: Angela Wu is a 54 year old right-handed white female with a history of chronic migraine headache.  She has a very strong family history of migraine with her father and son and a daughter also affected with headache.  She has had intermittent headaches throughout her adult life, but over the last for 5 years they have become more frequent, almost daily in nature.  She had the Covid virus infection in November 2020, since that time her headaches have been more severe and a bit more frequent.  The patient states that her headaches are frontal in nature, she thought she may have sinus disease and she was seen by an ENT physician and was told she did not have sinus issues.  The patient reports that she has some discomfort behind the eyes, she will have some generalization of the headache and associated neck and  shoulder discomfort.  She has noted that looking at a computer screen may worsen the headache, and she has noted that weather changes may also do the same.  She takes ibuprofen if needed for the headache, she has never been on a daily prophylactic medication.  She denies any numbness or weakness of extremities, she does note some slight gait instability but reports this has been present since she fractured her left patella.  She has chronic problems with insomnia, takes her several hours to get to sleep at night.  She denies issues controlling the bowels or the bladder, but she has had more frequent bowel movements since she had the Covid infection in November 2020.  She has no visual complaints with the headache, she does have photophobia and phonophobia, she denies nausea or vomiting.  She comes to the office today for an evaluation.    Observations/Objective: Via virtual visit, is alert and oriented Speech is clear and concise facial symmetry noted Mood is appropriate, judgment is clear Moving around freely during visit  Assessment and Plan: 1.  Chronic migraine headache  Headaches are overall improved, continue nortriptyline 30 mg at bedtime.  Continue Maxalt as needed for acute headache or ibuprofen.  Has never been on other preventative medications, could explore other options in the future.  Follow-up in 6 months or sooner if needed.  Follow Up Instructions: 6 months 11/28/2020 11:15   I discussed the assessment and treatment plan with the patient. The patient was provided an opportunity to ask questions and all were answered. The  patient agreed with the plan and demonstrated an understanding of the instructions.   The patient was advised to call back or seek an in-person evaluation if the symptoms worsen or if the condition fails to improve as anticipated.  I spent 20 minutes of face-to-face and non-face-to-face time with patient.  This included previsit chart review, lab review, study  review, order entry, electronic health record documentation, patient education.  Evangeline Dakin, DNP  [facility] [address] [place], [place] [address] [phone]

## 2020-05-29 NOTE — Telephone Encounter (Signed)
..   Pt understands that although there may be some limitations with this type of visit, we will take all precautions to reduce any security or privacy concerns.  Pt understands that this will be treated like an in office visit and we will file with pt's insurance, and there may be a patient responsible charge related to this service. ? ?

## 2020-05-30 ENCOUNTER — Telehealth (INDEPENDENT_AMBULATORY_CARE_PROVIDER_SITE_OTHER): Payer: BC Managed Care – PPO | Admitting: Neurology

## 2020-05-30 ENCOUNTER — Encounter: Payer: Self-pay | Admitting: Neurology

## 2020-05-30 DIAGNOSIS — G43019 Migraine without aura, intractable, without status migrainosus: Secondary | ICD-10-CM | POA: Diagnosis not present

## 2020-05-30 MED ORDER — RIZATRIPTAN BENZOATE 10 MG PO TABS: 10.0000 mg | ORAL_TABLET | Freq: Three times a day (TID) | ORAL | 3 refills | Status: DC | PRN

## 2020-05-30 MED ORDER — NORTRIPTYLINE HCL 10 MG PO CAPS: ORAL_CAPSULE | ORAL | 2 refills | Status: DC

## 2020-05-30 NOTE — Progress Notes (Signed)
I have read the note, and I agree with the clinical assessment and plan.  Kourtnie Sachs K Jenina Moening   

## 2020-05-31 ENCOUNTER — Other Ambulatory Visit: Payer: Self-pay | Admitting: Certified Nurse Midwife

## 2020-07-06 ENCOUNTER — Other Ambulatory Visit: Payer: Self-pay | Admitting: Certified Nurse Midwife

## 2020-07-13 ENCOUNTER — Telehealth: Payer: Self-pay

## 2020-07-13 ENCOUNTER — Other Ambulatory Visit: Payer: Self-pay

## 2020-07-13 MED ORDER — SERTRALINE HCL 100 MG PO TABS: 100.0000 mg | ORAL_TABLET | Freq: Every day | ORAL | 1 refills | Status: DC

## 2020-07-13 NOTE — Telephone Encounter (Signed)
Patient called in stating that she needs to talk with her provider about a medication adjustment. Patient refused to go into details with me. Patient stated she could do that when they give her a call back- patient stated this is a serious matter.   Could you please advise?

## 2020-07-13 NOTE — Telephone Encounter (Signed)
Please contact patient. Send Rx 100 mg Zoloft PO daily. Advise to schedule follow up with AT in 4-6 weeks for mood check or sooner if needed. Thanks, JML

## 2020-07-13 NOTE — Telephone Encounter (Signed)
mychart message sent to patient- what is question about medicine

## 2020-07-13 NOTE — Telephone Encounter (Signed)
Patient requested a telephone call at [place] on 07/13/20. Call was placed to patient- she states she is struggling and the Zoloft 50 mg 1 tablet daily is not enough for her. She is requesting an increase in dosage. She states she lost her mom in October 2021 and her son has been involuntarily admitted to a mental health unit. PhQ9= 18. GAD= 15. Please advise. No thoughts of harm to herself or others.

## 2020-08-08 ENCOUNTER — Other Ambulatory Visit: Payer: Self-pay | Admitting: Certified Nurse Midwife

## 2020-08-13 ENCOUNTER — Other Ambulatory Visit: Payer: Self-pay

## 2020-08-13 ENCOUNTER — Ambulatory Visit (INDEPENDENT_AMBULATORY_CARE_PROVIDER_SITE_OTHER): Payer: BC Managed Care – PPO | Admitting: Certified Nurse Midwife

## 2020-08-13 ENCOUNTER — Encounter: Payer: Self-pay | Admitting: Certified Nurse Midwife

## 2020-08-13 DIAGNOSIS — Z79899 Other long term (current) drug therapy: Secondary | ICD-10-CM | POA: Diagnosis not present

## 2020-08-13 DIAGNOSIS — E669 Obesity, unspecified: Secondary | ICD-10-CM

## 2020-08-13 DIAGNOSIS — Z7689 Persons encountering health services in other specified circumstances: Secondary | ICD-10-CM | POA: Diagnosis not present

## 2020-08-13 NOTE — Progress Notes (Signed)
Virtual Visit via Telephone Note  I connected with Angela Wu on 08/13/20 at  3:00 PM EST by telephone and verified that I am speaking with the correct person using two identifiers.  Location: Patient: At home Provider: at office    I discussed the limitations, risks, security and privacy concerns of performing an evaluation and management service by telephone and the availability of in person appointments. I also discussed with the patient that there may be a patient responsible charge related to this service. The patient expressed understanding and agreed to proceed.   History of Present Illness: . Referral was sent to psychiatry on 05/01/20 due to anxiety, pt noted increased anxiety and panic attacks due to the recent passing of her mother. Pt called the office 1/7/ 22 requesting medication for management.  Pt started on Zoloft . She presents today via tele visit for medication follow up.    Observations/Objective:   GAD 7 : Generalized Anxiety Score 08/13/2020  Nervous, Anxious, on Edge 3  Control/stop worrying 1  Worry too much - different things 1  Trouble relaxing 1  Restless 1  Easily annoyed or irritable 1  Afraid - awful might happen 3  Total GAD 7 Score 11  Anxiety Difficulty Somewhat difficult   PHQ9 SCORE ONLY 08/13/2020  PHQ-9 Total Score 13   Feels like she is doing better. State that some of the stress/anxiety was situation due to her son being admitted for psychiatric care for a few weeks. State he is home now and feels like her stress level and anxiety will continue to improve.     Assessment and Plan: A referral was placed for psychiatry but due to recent events with her son she had to cancel . She plans to call to reschedule. She will continue on the 100 mg daily and follow up with psychiatry .   She is asking for referral to nutritionist to help her with diet as she has been struggling with her weight and has been trying to lose weight. Orders placed.    Follow Up Instructions: PRN or for annual exam.    I discussed the assessment and treatment plan with the patient. The patient was provided an opportunity to ask questions and all were answered. The patient agreed with the plan and demonstrated an understanding of the instructions.   The patient was advised to call back or seek an in-person evaluation if the symptoms worsen or if the condition fails to improve as anticipated.  I provided 10 minutes of non-face-to-face time during this encounter.   Philip [place], CNM

## 2020-08-13 NOTE — Patient Instructions (Signed)

## 2020-08-13 NOTE — Progress Notes (Signed)
Received a transferred call from [place] for a televisit. DOB as identifier. PhQ9=13; GAD= 11. Patient is taking zoloft 100mg  daily. Call transferred to [place] for completion of televisit.

## 2020-08-14 NOTE — Addendum Note (Signed)
Addended by: Hildred Priest on: 08/14/2020 10:43 AM   Modules accepted: Orders

## 2020-09-28 ENCOUNTER — Ambulatory Visit
Admission: RE | Admit: 2020-09-28 | Discharge: 2020-09-28 | Disposition: A | Discharge status: Home / Self Care | Payer: BC Managed Care – PPO | Source: Ambulatory Visit | Attending: Certified Nurse Midwife | Admitting: Certified Nurse Midwife

## 2020-09-28 ENCOUNTER — Other Ambulatory Visit: Payer: Self-pay

## 2020-09-28 DIAGNOSIS — Z1231 Encounter for screening mammogram for malignant neoplasm of breast: Secondary | ICD-10-CM | POA: Insufficient documentation

## 2020-10-08 ENCOUNTER — Other Ambulatory Visit: Payer: Self-pay | Admitting: Certified Nurse Midwife

## 2020-11-21 ENCOUNTER — Other Ambulatory Visit: Payer: Self-pay

## 2020-11-21 MED ORDER — SERTRALINE HCL 50 MG PO TABS: 50.0000 mg | ORAL_TABLET | Freq: Every day | ORAL | 1 refills | Status: DC

## 2020-11-29 ENCOUNTER — Ambulatory Visit: Payer: BC Managed Care – PPO | Admitting: Neurology

## 2020-12-24 ENCOUNTER — Encounter: Payer: BC Managed Care – PPO | Attending: Certified Nurse Midwife | Admitting: Dietician

## 2020-12-24 ENCOUNTER — Encounter: Payer: Self-pay | Admitting: Dietician

## 2020-12-24 ENCOUNTER — Other Ambulatory Visit: Payer: Self-pay

## 2020-12-24 VITALS — Ht 65.0 in | Wt 237.9 lb

## 2020-12-24 DIAGNOSIS — Z6835 Body mass index (BMI) 35.0-35.9, adult: Secondary | ICD-10-CM | POA: Diagnosis not present

## 2020-12-24 DIAGNOSIS — E669 Obesity, unspecified: Secondary | ICD-10-CM | POA: Diagnosis not present

## 2020-12-24 NOTE — Patient Instructions (Signed)
Work on low fat/ sugar food choices using [employer] provided.  High fiber foods and small amount of healthy fats help with fulness and prevent low blood sugar symptoms Control portions -- especially starches -- for calorie and blood sugar control; low carb veggies are unlimited. Continue with regular exercise as able, great job!

## 2020-12-24 NOTE — Progress Notes (Signed)
Medical Nutrition Therapy: Visit start time: 1600  end time: 1710  Assessment:  Diagnosis: obesity Past medical history: hyperlipidemia, hypoglycemia, chronic pain Psychosocial issues/ stress concerns: none  Preferred learning method:  Auditory Visual   Current weight: 237.9lbs with sandals Height: 5'5" BMI: 39.59 Medications, supplements: reconciled list in medical record  Progress and evaluation:  Patient reports fracture in right knee cap several years ago, which continues to limit activity -- can tolerate recumbent bike, some walking. Has lost weight with weight watchers in the past. She has experienced some major life events in the past 2 years which have made healthy lifestyle changes/ habits very difficult Accelerated weight gain in past 2-3 years with stress of her mom's serious illness, son's mental health, and pandemic   Physical activity: walking 20 minutes (1/2 mile) daily  Dietary Intake:  Usual eating pattern includes 3 meals and 2-3 snacks per day. Dining out frequency: 2-3 meals per week.  Breakfast: cereal; grits; oatmeal; waffle with [place] sausage and OJ; occ fruit only ie banana; bagel with sliced [place] and cream cheese; occ peanut butter sandwich and milk Snack: sm bowl bbq chips; chips and salsa or hummus or queso; peanut butter; grapefruit or fruit cup or applesauce; crackers and cheese Lunch: bagel with veggie cr cheese and tukey; [place] and cheese sandwich, pbj sandwich; salad with feta, dried pepper strips, reg or light dressing, diet soda; leftovers; beef hot dog; pasta; occ cereal Snack: same as am or 3 twizzlers; [place] sausage Supper: same as lunch when alone; cooks when son is home-- Editor, commissioning on grill; stuffed chicken (broccoli cheese) + rice + veg ie asparagus/ green beans; pizza; occ takeout Snack: usually none; occ same as am/pm Beverages: water; diet soda; milk; occasionally juice/ coffee with cream and flavored sweetener  Nutrition  Care Education: Topics covered:  Basic nutrition: basic food groups, appropriate nutrient balance, appropriate meal and snack schedule, general nutrition guidelines    Weight control: importance of low sugar and low fat choices; portion control strategies; estimated energy needs for weight loss at 1300 kcal, provided guidance for 45% CHO, 25% pro, 30% fat; balanced meal and snack options; role of exercise Other: promoting satiety and preventing hypoglycemia by controlling portions of high-carb foods, choosing high fiber foods and including protein source, small amount of healthy fat   Nutritional Diagnosis:  [place]-3.3 Overweight/obesity As related to excess calories, limited activity due to injury, stress.  As evidenced by patient with current BMI of 39.59.  Intervention:  Instruction and discussion as noted above. Patient has worked on weight loss successfully in the past. She is past the recent life stressors and is ready to make positive lifestyle changes.  Established nutrition goals with direction from patient.  Education Materials given:  Museum/gallery conservator with food lists, sample meal pattern Sample menus Patient will view visit summary with goals/ instructions via MyChart   Learner/ who was taught:  Patient    Level of understanding: Verbalizes/ demonstrates competency   Demonstrated degree of understanding via:   Teach back Learning barriers: None  Willingness to learn/ readiness for change: Acceptance, ready for change   Monitoring and Evaluation:  Dietary intake, exercise, and body weight      follow up:  01/28/21 at 4:30pm

## 2020-12-25 ENCOUNTER — Telehealth: Payer: BC Managed Care – PPO | Admitting: Neurology

## 2020-12-25 ENCOUNTER — Telehealth: Payer: Self-pay | Admitting: Neurology

## 2020-12-25 NOTE — Telephone Encounter (Signed)
Cancelled due to sarah out/

## 2020-12-25 NOTE — Progress Notes (Deleted)
Virtual Visit via Video Note  I connected with Angela Wu on 12/25/20 at  2:15 PM EDT by a video enabled telemedicine application and verified that I am speaking with the correct person using two identifiers.  Location: Patient: *** Provider: ***   I discussed the limitations of evaluation and management by telemedicine and the availability of in person appointments. The patient expressed understanding and agreed to proceed.  History of Present Illness: 12/25/2020 SS:   05/30/2020 SS: Angela Wu is a 55 year old female with history of chronic migraine headache.  Currently taking nortriptyline 30 mg at bedtime, headaches are 50% better, is tolerating well.  Unclear if benefit with sleep, lifelong insomnia.  Has yet to take Maxalt.  For acute headache, will take ibuprofen or Tylenol with good benefit.  Has currently been dealing with prolonged URI/sinus issues, hard to accurately judge headaches, but overall thinks is improved.  Works from home, does not miss work due to headaches, works for [employer].  Has 2 grown children. Presents today for evaluation via virtual visit     Observations/Objective:   Assessment and Plan:   Follow Up Instructions:    I discussed the assessment and treatment plan with the patient. The patient was provided an opportunity to ask questions and all were answered. The patient agreed with the plan and demonstrated an understanding of the instructions.   The patient was advised to call back or seek an in-person evaluation if the symptoms worsen or if the condition fails to improve as anticipated.  I provided *** minutes of non-face-to-face time during this encounter.   Suzzanne Cloud, NP

## 2020-12-26 ENCOUNTER — Telehealth (INDEPENDENT_AMBULATORY_CARE_PROVIDER_SITE_OTHER): Payer: BC Managed Care – PPO | Admitting: Adult Health

## 2020-12-26 DIAGNOSIS — G43019 Migraine without aura, intractable, without status migrainosus: Secondary | ICD-10-CM | POA: Diagnosis not present

## 2020-12-26 MED ORDER — RIZATRIPTAN BENZOATE 10 MG PO TABS: ORAL_TABLET | ORAL | 3 refills | Status: DC

## 2020-12-26 MED ORDER — NORTRIPTYLINE HCL 10 MG PO CAPS: ORAL_CAPSULE | ORAL | 3 refills | Status: DC

## 2020-12-26 NOTE — Progress Notes (Signed)
I have read the note, and I agree with the clinical assessment and plan.  Jeniel Slauson K Faust [place]orington   

## 2020-12-26 NOTE — Progress Notes (Signed)
PATIENT: Angela Wu DOB: 1966/04/13  REASON FOR VISIT: follow up HISTORY FROM: patient  Virtual Visit via Video Note  I connected with Mont Dutton on 12/26/20 at  8:30 AM EDT by a video enabled telemedicine application located remotely at [facility] and verified that I am speaking with the correct person using two identifiers who was located at their own home.   I discussed the limitations of evaluation and management by telemedicine and the availability of in person appointments. The patient expressed understanding and agreed to proceed.   PATIENT: Angela Wu DOB: 14-Nov-1965  REASON FOR VISIT: follow up HISTORY FROM: patient Primary neurologist: Dr. Jannifer Franklin  HISTORY OF PRESENT ILLNESS: Today 12/26/20:  Angela Wu is a 55 year old female with a history of chronic migraine headaches.  She returns today for follow-up.  She states that she takes nortriptyline nightly she typically does not have a headache now.  She has been taking 30 mg at bedtime.  She reports that she does get a headache she typically can take Maxalt and the headache resolves fairly quickly.  Overall she is doing well.  She joins me for a virtual visit.  HISTORY 05/30/2020 SS: Angela Wu is a 55 year old female with history of chronic migraine headache.  Currently taking nortriptyline 30 mg at bedtime, headaches are 50% better, is tolerating well.  Unclear if benefit with sleep, lifelong insomnia.  Has yet to take Maxalt.  For acute headache, will take ibuprofen or Tylenol with good benefit.  Has currently been dealing with prolonged URI/sinus issues, hard to accurately judge headaches, but overall thinks is improved.  Works from home, does not miss work due to headaches, works for [employer].  Has 2 grown children. Presents today for evaluation via virtual visit    REVIEW OF SYSTEMS: Out of a complete 14 system review of symptoms, the patient complains only of the following symptoms, and all  other reviewed systems are negative.  See HPI  ALLERGIES: No Known Allergies  HOME MEDICATIONS: Outpatient Medications Prior to Visit  Medication Sig Dispense Refill   Ascorbic Acid (VITAMIN C PO) Take by mouth.     atorvastatin (LIPITOR) 20 MG tablet Take 1 tablet by mouth daily.     Cyanocobalamin (VITAMIN B12 PO) Take by mouth.     dicyclomine (BENTYL) 10 MG capsule Take by mouth.     ibuprofen (ADVIL,MOTRIN) 800 MG tablet Take 1 tablet (800 mg total) by mouth every 8 (eight) hours as needed. 30 tablet 0   Multiple Vitamins-Minerals (ZINC PO) Take by mouth.     norethindrone-ethinyl estradiol-iron (JUNEL FE 1.5/30) 1.5-30 MG-MCG tablet Take 1 tablet by mouth daily. 84 tablet 3   pantoprazole (PROTONIX) 20 MG tablet Take 20 mg by mouth 2 (two) times daily.     sertraline (ZOLOFT) 50 MG tablet Take 1 tablet by mouth daily.     Vitamin D, Ergocalciferol, (DRISDOL) 1.25 MG (50000 UNIT) CAPS capsule Take 1 capsule (50,000 Units total) by mouth every 7 (seven) days. 84 capsule 3   nortriptyline (PAMELOR) 10 MG capsule Take 3 capsules at bedtime 270 capsule 2   rizatriptan (MAXALT) 10 MG tablet Take 1 tablet (10 mg total) by mouth 3 (three) times daily as needed for migraine. 10 tablet 3   No facility-administered medications prior to visit.    PAST MEDICAL HISTORY: Past Medical History:  Diagnosis Date   Anxiety    Arthritis    hands   Atypical face pain  Chronic insomnia 03/09/2020   Common migraine with intractable migraine 03/09/2020   Hyperlipidemia    Irregular bleeding    Scoliosis    Dx as child. no current issues   Sleep disturbance    Vertigo    no episodes approx 6 mos   Vitamin D deficiency     PAST SURGICAL HISTORY: Past Surgical History:  Procedure Laterality Date   CESAREAN SECTION     COLONOSCOPY WITH PROPOFOL N/A 01/27/2019   Procedure: COLONOSCOPY WITH PROPOFOL;  Surgeon: Virgel Manifold, MD;  Location: [facility];  Service: Endoscopy;   Laterality: N/A;   COLONOSCOPY WITH PROPOFOL N/A 05/10/2020   Procedure: COLONOSCOPY WITH PROPOFOL;  Surgeon: Virgel Manifold, MD;  Location: [facility];  Service: Endoscopy;  Laterality: N/A;   KNEE SURGERY Left    fractured knee cap    FAMILY HISTORY: Family History  Problem Relation Age of Onset   Heart disease Father    Heart attack Father    Cancer Mother    Breast cancer Neg Hx     SOCIAL HISTORY: Social History   Socioeconomic History   Marital status: Divorced    Spouse name: Not on file   Number of children: Not on file   Years of education: Not on file   Highest education level: Not on file  Occupational History   Not on file  Tobacco Use   Smoking status: Never   Smokeless tobacco: Never  Vaping Use   Vaping Use: Never used  Substance and Sexual Activity   Alcohol use: Yes    Comment: may have a drink every other month   Drug use: No   Sexual activity: Not Currently    Birth control/protection: Pill  Other Topics Concern   Not on file  Social History Narrative   Not on file   Social Determinants of Health   Financial Resource Strain: Not on file  Food Insecurity: Not on file  Transportation Needs: Not on file  Physical Activity: Not on file  Stress: Not on file  Social Connections: Not on file  Intimate Partner Violence: Not on file      PHYSICAL EXAM Generalized: Well developed, in no acute distress   Neurological examination  Mentation: Alert oriented to time, place, history taking. Follows all commands speech and language fluent Cranial nerve II-XII:Extraocular movements were full. Facial symmetry noted. uvula tongue midline. Head turning and shoulder shrug  were normal and symmetric. Motor: Good strength throughout subjectively per patient Sensory: Sensory testing is intact to soft touch on all 4 extremities subjectively per patient Coordination: Cerebellar testing reveals good finger-nose-finger  Gait and station: Patient is  able to stand from a seated position. gait is normal.  Reflexes: UTA  DIAGNOSTIC DATA (LABS, IMAGING, TESTING) - I reviewed patient records, labs, notes, testing and imaging myself where available.  Lab Results  Component Value Date   WBC 8.6 04/29/2017   HGB 14.2 04/29/2017   HCT 42.3 04/29/2017   MCV 86.2 04/29/2017   PLT 409 04/29/2017      Component Value Date/Time   NA 137 04/29/2017 1819   NA 140 05/13/2016 1452   K 3.9 04/29/2017 1819   CL 101 04/29/2017 1819   CO2 26 04/29/2017 1819   GLUCOSE 91 04/29/2017 1819   BUN 8 04/29/2017 1819   BUN 11 05/13/2016 1452   CREATININE 0.64 04/29/2017 1819   CALCIUM 9.5 04/29/2017 1819   PROT 7.4 05/13/2016 1452   ALBUMIN 4.4 05/13/2016 1452  AST 13 05/13/2016 1452   ALT 12 05/13/2016 1452   ALKPHOS 99 05/13/2016 1452   BILITOT 0.4 05/13/2016 1452   GFRNONAA >60 04/29/2017 1819   GFRAA >60 04/29/2017 1819   Lab Results  Component Value Date   CHOL 264 (H) 05/13/2016   HDL 43 05/13/2016   LDLCALC 152 (H) 05/13/2016   TRIG 346 (H) 05/13/2016   CHOLHDL 6.1 (H) 05/13/2016   Lab Results  Component Value Date   HGBA1C 5.2 05/13/2016   No results found for: VITAMINB12 Lab Results  Component Value Date   TSH 3.430 05/13/2016      ASSESSMENT AND PLAN 55 y.o. year old female  has a past medical history of Anxiety, Arthritis, Atypical face pain, Chronic insomnia (03/09/2020), Common migraine with intractable migraine (03/09/2020), Hyperlipidemia, Irregular bleeding, Scoliosis, Sleep disturbance, Vertigo, and Vitamin D deficiency. here with:  1.  Migraine headaches  --Continue nortriptyline 30 mg at bedtime --Continue Maxalt for abortive therapy --Follow-up in 1 year or sooner if needed      Ward Givens, MSN, NP-C 12/26/2020, 8:45 AM [facility] [address] [place], [place] [address] [phone]

## 2021-01-28 ENCOUNTER — Ambulatory Visit: Payer: BC Managed Care – PPO | Admitting: Dietician

## 2021-02-20 ENCOUNTER — Ambulatory Visit: Payer: BC Managed Care – PPO | Admitting: Dietician

## 2021-02-27 ENCOUNTER — Encounter: Payer: Self-pay | Admitting: Dietician

## 2021-02-27 NOTE — Progress Notes (Signed)
Have not heard back from patient to reschedule her missed appointment from 02/10/21 (rescheduled from 01/28/21). Sent notification to referring provider.

## 2021-04-12 ENCOUNTER — Other Ambulatory Visit: Payer: Self-pay | Admitting: Certified Nurse Midwife

## 2021-04-12 DIAGNOSIS — Z3041 Encounter for surveillance of contraceptive pills: Secondary | ICD-10-CM

## 2021-05-06 ENCOUNTER — Other Ambulatory Visit (HOSPITAL_COMMUNITY)
Admission: RE | Admit: 2021-05-06 | Discharge: 2021-05-06 | Disposition: A | Discharge status: Home / Self Care | Payer: BC Managed Care – PPO | Source: Ambulatory Visit | Attending: Certified Nurse Midwife | Admitting: Certified Nurse Midwife

## 2021-05-06 ENCOUNTER — Encounter: Payer: Self-pay | Admitting: Certified Nurse Midwife

## 2021-05-06 ENCOUNTER — Ambulatory Visit (INDEPENDENT_AMBULATORY_CARE_PROVIDER_SITE_OTHER): Payer: BC Managed Care – PPO | Admitting: Certified Nurse Midwife

## 2021-05-06 ENCOUNTER — Other Ambulatory Visit: Payer: Self-pay

## 2021-05-06 VITALS — BP 131/85 | HR 96 | Ht 65.0 in | Wt 235.0 lb

## 2021-05-06 DIAGNOSIS — Z01419 Encounter for gynecological examination (general) (routine) without abnormal findings: Secondary | ICD-10-CM | POA: Diagnosis present

## 2021-05-06 DIAGNOSIS — Z113 Encounter for screening for infections with a predominantly sexual mode of transmission: Secondary | ICD-10-CM

## 2021-05-06 DIAGNOSIS — Z1231 Encounter for screening mammogram for malignant neoplasm of breast: Secondary | ICD-10-CM

## 2021-05-06 NOTE — Progress Notes (Signed)
GYNECOLOGY ANNUAL PREVENTATIVE CARE ENCOUNTER NOTE  History:     SARAHELIZABETH CONWAY is a 55 y.o. G66P2002 female here for a routine annual gynecologic exam.  Current complaints: none.   Denies abnormal vaginal bleeding, discharge, pelvic pain, problems with intercourse or other gynecologic concerns.     Social Relationship: Single Living: alone ( 2 dogs) Work: [employer], Environmental manager inclusion/diversity Exercise: walking dogs Smoke/Alcohol/drug use: occasional alcohol use, denies drugs and smoking  Gynecologic History No LMP recorded. Contraception: OCP (estrogen/progesterone)-stopping  Last Pap: 05/13/16. Results were: normal with negative HPV Last mammogram: 09/28/2020. Results were: normal  Obstetric History OB History  Gravida Para Term Preterm AB Living  2 2 2     2   SAB IAB Ectopic Multiple Live Births          2    # Outcome Date GA Lbr Len/2nd Weight Sex Delivery Anes PTL Lv  2 Term 2002 [redacted]w[redacted]d  9 lb 2 oz (4.139 kg) M VBAC  N LIV  1 Term 1999 [redacted]w[redacted]d  8 lb 7 oz (3.827 kg) F CS-Unspec  N LIV    Past Medical History:  Diagnosis Date   Anxiety    Arthritis    hands   Atypical face pain    Chronic insomnia 03/09/2020   Common migraine with intractable migraine 03/09/2020   Hyperlipidemia    Irregular bleeding    Scoliosis    Dx as child. no current issues   Sleep disturbance    Vertigo    no episodes approx 6 mos   Vitamin D deficiency     Past Surgical History:  Procedure Laterality Date   CESAREAN SECTION     COLONOSCOPY WITH PROPOFOL N/A 01/27/2019   Procedure: COLONOSCOPY WITH PROPOFOL;  Surgeon: Virgel Manifold, MD;  Location: [facility];  Service: Endoscopy;  Laterality: N/A;   COLONOSCOPY WITH PROPOFOL N/A 05/10/2020   Procedure: COLONOSCOPY WITH PROPOFOL;  Surgeon: Virgel Manifold, MD;  Location: [facility];  Service: Endoscopy;  Laterality: N/A;   KNEE SURGERY Left    fractured knee cap    Current Outpatient Medications on File Prior to Visit   Medication Sig Dispense Refill   Ascorbic Acid (VITAMIN C PO) Take by mouth.     atorvastatin (LIPITOR) 20 MG tablet Take 1 tablet by mouth daily.     Cyanocobalamin (VITAMIN B12 PO) Take by mouth.     dicyclomine (BENTYL) 10 MG capsule Take by mouth.     ibuprofen (ADVIL,MOTRIN) 800 MG tablet Take 1 tablet (800 mg total) by mouth every 8 (eight) hours as needed. 30 tablet 0   JUNEL FE 1.5/30 1.5-30 MG-MCG tablet TAKE 1 TABLET BY MOUTH EVERY DAY 84 tablet 3   Multiple Vitamins-Minerals (ZINC PO) Take by mouth.     nortriptyline (PAMELOR) 10 MG capsule Take 3 capsules at bedtime 270 capsule 3   pantoprazole (PROTONIX) 20 MG tablet Take 20 mg by mouth 2 (two) times daily.     rizatriptan (MAXALT) 10 MG tablet Take 1 tablet at the onset of migraine. Can repeat in 2 hours if needed. 2 tabs/24hours 10 tablet 3   sertraline (ZOLOFT) 50 MG tablet Take 1 tablet by mouth daily.     Vitamin D, Ergocalciferol, (DRISDOL) 1.25 MG (50000 UNIT) CAPS capsule Take 1 capsule (50,000 Units total) by mouth every 7 (seven) days. 84 capsule 3   No current facility-administered medications on file prior to visit.    No Known Allergies  Social History:  reports that she has never smoked. She has never used smokeless tobacco. She reports current alcohol use. She reports that she does not use drugs.  Family History  Problem Relation Age of Onset   Heart disease Father    Heart attack Father    Cancer Mother    Breast cancer Neg Hx     The following portions of the patient's history were reviewed and updated as appropriate: allergies, current medications, past family history, past medical history, past social history, past surgical history and problem list.  Review of Systems Pertinent items noted in HPI and remainder of comprehensive ROS otherwise negative.  Physical Exam:  There were no vitals taken for this visit. CONSTITUTIONAL: Well-developed, well-nourished female in no acute distress.  HENT:   Normocephalic, atraumatic, External right and left ear normal. Oropharynx is clear and moist EYES: Conjunctivae and EOM are normal. Pupils are equal, round, and reactive to light. No scleral icterus.  NECK: Normal range of motion, supple, no masses.  Normal thyroid.  SKIN: Skin is warm and dry. No rash noted. Not diaphoretic. No erythema. No pallor. MUSCULOSKELETAL: Normal range of motion. No tenderness.  No cyanosis, clubbing, or edema.  2+ distal pulses. NEUROLOGIC: Alert and oriented to person, place, and time. Normal reflexes, muscle tone coordination.  PSYCHIATRIC: Normal mood and affect. Normal behavior. Normal judgment and thought content. CARDIOVASCULAR: Normal heart rate noted, regular rhythm RESPIRATORY: Clear to auscultation bilaterally. Effort and breath sounds normal, no problems with respiration noted. BREASTS: Symmetric in size. No masses, tenderness, skin changes, nipple drainage, or lymphadenopathy bilaterally.  ABDOMEN: Soft, no distention noted.  No tenderness, rebound or guarding.  PELVIC: Normal appearing external genitalia and urethral meatus; normal appearing vaginal mucosa and cervix.  No abnormal discharge noted.  Pap smear obtained.  Contact bleeding noted Normal uterine size, no other palpable masses, no uterine or adnexal tenderness.  .   Assessment and Plan:    1. Well woman exam with routine gynecological exam  Pap: Will follow up results of pap smear and manage accordingly. Mammogram : ordered Labs: HIV, Hep C  Refills: none Referral: none  Discussed cessation of OCP. She is in agreement. Discussed treatment of any side effect with SSRI and or HRT. Information sheet given.  Routine preventative health maintenance measures emphasized. Please refer to After Visit Summary for other counseling recommendations.       Philip Aspen, CNM [facility]

## 2021-05-06 NOTE — Patient Instructions (Signed)
Preventive Care 40-53 Years Old, Female Preventive care refers to lifestyle choices and visits with your health care provider that can promote health and wellness. This includes: A yearly physical exam. This is also called an annual wellness visit. Regular dental and eye exams. Immunizations. Screening for certain conditions. Healthy lifestyle choices, such as: Eating a healthy diet. Getting regular exercise. Not using drugs or products that contain nicotine and tobacco. Limiting alcohol use. What can I expect for my preventive care visit? Physical exam Your health care provider will check your: Height and weight. These may be used to calculate your BMI (body mass index). BMI is a measurement that tells if you are at a healthy weight. Heart rate and blood pressure. Body temperature. Skin for abnormal spots. Counseling Your health care provider may ask you questions about your: Past medical problems. Family's medical history. Alcohol, tobacco, and drug use. Emotional well-being. Home life and relationship well-being. Sexual activity. Diet, exercise, and sleep habits. Work and work environment. Access to firearms. Method of birth control. Menstrual cycle. Pregnancy history. What immunizations do I need? Vaccines are usually given at various ages, according to a schedule. Your health care provider will recommend vaccines for you based on your age, medical history, and lifestyle or other factors, such as travel or where you work. What tests do I need? Blood tests Lipid and cholesterol levels. These may be checked every 5 years, or more often if you are over 50 years old. Hepatitis C test. Hepatitis B test. Screening Lung cancer screening. You may have this screening every year starting at age 55 if you have a 30-pack-year history of smoking and currently smoke or have quit within the past 15 years. Colorectal cancer screening. All adults should have this screening starting at  age 50 and continuing until age 75. Your health care provider may recommend screening at age 45 if you are at increased risk. You will have tests every 1-10 years, depending on your results and the type of screening test. Diabetes screening. This is done by checking your blood sugar (glucose) after you have not eaten for a while (fasting). You may have this done every 1-3 years. Mammogram. This may be done every 1-2 years. Talk with your health care provider about when you should start having regular mammograms. This may depend on whether you have a family history of breast cancer. BRCA-related cancer screening. This may be done if you have a family history of breast, ovarian, tubal, or peritoneal cancers. Pelvic exam and Pap test. This may be done every 3 years starting at age 21. Starting at age 30, this may be done every 5 years if you have a Pap test in combination with an HPV test. Other tests STD (sexually transmitted disease) testing, if you are at risk. Bone density scan. This is done to screen for osteoporosis. You may have this scan if you are at high risk for osteoporosis. Talk with your health care provider about your test results, treatment options, and if necessary, the need for more tests. Follow these instructions at home: Eating and drinking  Eat a diet that includes fresh fruits and vegetables, whole grains, lean protein, and low-fat dairy products. Take vitamin and mineral supplements as recommended by your health care provider. Do not drink alcohol if: Your health care provider tells you not to drink. You are pregnant, may be pregnant, or are planning to become pregnant. If you drink alcohol: Limit how much you have to 0-1 drink a day. Be   aware of how much alcohol is in your drink. In the [place] one drink equals one 12 oz bottle of beer (355 mL), one 5 oz glass of wine (148 mL), or one 1 oz glass of hard liquor (44 mL). Lifestyle Take daily care of your teeth and  gums. Brush your teeth every morning and night with fluoride toothpaste. Floss one time each day. Stay active. Exercise for at least 30 minutes 5 or more days each week. Do not use any products that contain nicotine or tobacco, such as cigarettes, e-cigarettes, and chewing tobacco. If you need help quitting, ask your health care provider. Do not use drugs. If you are sexually active, practice safe sex. Use a condom or other form of protection to prevent STIs (sexually transmitted infections). If you do not wish to become pregnant, use a form of birth control. If you plan to become pregnant, see your health care provider for a prepregnancy visit. If told by your health care provider, take low-dose aspirin daily starting at age 63. Find healthy ways to cope with stress, such as: Meditation, yoga, or listening to music. Journaling. Talking to a trusted person. Spending time with friends and family. Safety Always wear your seat belt while driving or riding in a vehicle. Do not drive: If you have been drinking alcohol. Do not ride with someone who has been drinking. When you are tired or distracted. While texting. Wear a helmet and other protective equipment during sports activities. If you have firearms in your house, make sure you follow all gun safety procedures. What's next? Visit your health care provider once a year for an annual wellness visit. Ask your health care provider how often you should have your eyes and teeth checked. Stay up to date on all vaccines. This information is not intended to replace advice given to you by your health care provider. Make sure you discuss any questions you have with your health care provider. Document Revised: 08/31/2020 Document Reviewed: 03/04/2018 Elsevier Patient Education  2022 [employer].

## 2021-05-09 LAB — CYTOLOGY - PAP
Comment: NEGATIVE
Diagnosis: NEGATIVE
High risk HPV: NEGATIVE

## 2021-06-17 ENCOUNTER — Other Ambulatory Visit: Payer: Self-pay | Admitting: Certified Nurse Midwife

## 2021-07-19 ENCOUNTER — Other Ambulatory Visit: Payer: Self-pay | Admitting: Certified Nurse Midwife

## 2021-08-06 ENCOUNTER — Encounter: Payer: Self-pay | Admitting: Certified Nurse Midwife

## 2021-10-10 ENCOUNTER — Encounter: Payer: Self-pay | Admitting: Certified Nurse Midwife

## 2021-10-18 ENCOUNTER — Other Ambulatory Visit: Payer: Self-pay | Admitting: Certified Nurse Midwife

## 2021-12-03 ENCOUNTER — Ambulatory Visit
Admission: RE | Admit: 2021-12-03 | Discharge: 2021-12-03 | Disposition: A | Discharge status: Home / Self Care | Payer: BC Managed Care – PPO | Source: Ambulatory Visit | Attending: Certified Nurse Midwife | Admitting: Certified Nurse Midwife

## 2021-12-03 DIAGNOSIS — Z1231 Encounter for screening mammogram for malignant neoplasm of breast: Secondary | ICD-10-CM | POA: Insufficient documentation

## 2021-12-24 ENCOUNTER — Telehealth (INDEPENDENT_AMBULATORY_CARE_PROVIDER_SITE_OTHER): Payer: BC Managed Care – PPO | Admitting: Adult Health

## 2021-12-24 DIAGNOSIS — G43019 Migraine without aura, intractable, without status migrainosus: Secondary | ICD-10-CM | POA: Diagnosis not present

## 2021-12-24 NOTE — Progress Notes (Signed)
PATIENT: Angela Wu DOB: Oct 22, 1965  REASON FOR VISIT: follow up HISTORY FROM: patient  Virtual Visit via Video Note  I connected with Mont Dutton on 12/24/21 at 11:30 AM EDT by a video enabled telemedicine application located remotely at [facility] and verified that I am speaking with the correct person using two identifiers who was located at their own home.   I discussed the limitations of evaluation and management by telemedicine and the availability of in person appointments. The patient expressed understanding and agreed to proceed.   PATIENT: Angela Wu DOB: 11-21-1965  REASON FOR VISIT: follow up HISTORY FROM: patient Primary neurologist: Dr. Jannifer Franklin  HISTORY OF PRESENT ILLNESS: Today 12/24/21: Angela Wu is a 56 year old female with a history of migraine headaches.  She returns today for follow-up.  She reports that it is rare for her to have a headache. She does have headache today but relates that to stress from work. Uses maxalt only for severe headache. Uses OTC medication for mild headaches.  Overall she feels that she is doing well.  Continues on nortriptyline 30 mg at bedtime  05/30/20:Angela Wu is a 56 year old female with a history of chronic migraine headaches.  She returns today for follow-up.  She states that she takes nortriptyline nightly she typically does not have a headache now.  She has been taking 30 mg at bedtime.  She reports that she does get a headache she typically can take Maxalt and the headache resolves fairly quickly.  Overall she is doing well.  She joins me for a virtual visit.  HISTORY 05/30/2020 SS: Angela Wu is a 56 year old female with history of chronic migraine headache.  Currently taking nortriptyline 30 mg at bedtime, headaches are 50% better, is tolerating well.  Unclear if benefit with sleep, lifelong insomnia.  Has yet to take Maxalt.  For acute headache, will take ibuprofen or Tylenol with good benefit.  Has  currently been dealing with prolonged URI/sinus issues, hard to accurately judge headaches, but overall thinks is improved.  Works from home, does not miss work due to headaches, works for [employer].  Has 2 grown children. Presents today for evaluation via virtual visit    REVIEW OF SYSTEMS: Out of a complete 14 system review of symptoms, the patient complains only of the following symptoms, and all other reviewed systems are negative.  See HPI  ALLERGIES: Allergies  Allergen Reactions   Amoxicillin Rash    HOME MEDICATIONS: Outpatient Medications Prior to Visit  Medication Sig Dispense Refill   Ascorbic Acid (VITAMIN C PO) Take by mouth.     atorvastatin (LIPITOR) 20 MG tablet Take 1 tablet by mouth daily.     Cyanocobalamin (VITAMIN B12 PO) Take by mouth.     dicyclomine (BENTYL) 10 MG capsule Take by mouth.     ibuprofen (ADVIL,MOTRIN) 800 MG tablet Take 1 tablet (800 mg total) by mouth every 8 (eight) hours as needed. 30 tablet 0   JUNEL FE 1.5/30 1.5-30 MG-MCG tablet TAKE 1 TABLET BY MOUTH EVERY DAY 84 tablet 3   Multiple Vitamins-Minerals (ZINC PO) Take by mouth.     nortriptyline (PAMELOR) 10 MG capsule Take 3 capsules at bedtime 270 capsule 3   pantoprazole (PROTONIX) 20 MG tablet Take 20 mg by mouth 2 (two) times daily.     rizatriptan (MAXALT) 10 MG tablet Take 1 tablet at the onset of migraine. Can repeat in 2 hours if needed. 2 tabs/24hours 10 tablet 3  sertraline (ZOLOFT) 50 MG tablet TAKE 1 TABLET BY MOUTH EVERY DAY 90 tablet 1   Vitamin D, Ergocalciferol, (DRISDOL) 1.25 MG (50000 UNIT) CAPS capsule TAKE 1 CAPSULE (50,000 UNITS TOTAL) BY MOUTH EVERY 7 (SEVEN) DAYS 12 capsule 27   No facility-administered medications prior to visit.    PAST MEDICAL HISTORY: Past Medical History:  Diagnosis Date   Anxiety    Arthritis    hands   Atypical face pain    Chronic insomnia 03/09/2020   Common migraine with intractable migraine 03/09/2020   Hyperlipidemia    Irregular  bleeding    Scoliosis    Dx as child. no current issues   Sleep disturbance    Vertigo    no episodes approx 6 mos   Vitamin D deficiency     PAST SURGICAL HISTORY: Past Surgical History:  Procedure Laterality Date   CESAREAN SECTION     COLONOSCOPY WITH PROPOFOL N/A 01/27/2019   Procedure: COLONOSCOPY WITH PROPOFOL;  Surgeon: Virgel Manifold, MD;  Location: [facility] ENDOSCOPY;  Service: Endoscopy;  Laterality: N/A;   COLONOSCOPY WITH PROPOFOL N/A 05/10/2020   Procedure: COLONOSCOPY WITH PROPOFOL;  Surgeon: Virgel Manifold, MD;  Location: [facility] ENDOSCOPY;  Service: Endoscopy;  Laterality: N/A;   KNEE SURGERY Left    fractured knee cap    FAMILY HISTORY: Family History  Problem Relation Age of Onset   Cancer Mother    Heart disease Father    Heart attack Father    Rectal cancer Father    Breast cancer Paternal Grandmother     SOCIAL HISTORY: Social History   Socioeconomic History   Marital status: Divorced    Spouse name: Not on file   Number of children: Not on file   Years of education: Not on file   Highest education level: Not on file  Occupational History   Not on file  Tobacco Use   Smoking status: Never   Smokeless tobacco: Never  Vaping Use   Vaping Use: Never used  Substance and Sexual Activity   Alcohol use: Yes    Comment: rarely   Drug use: No   Sexual activity: Not Currently    Birth control/protection: Pill  Other Topics Concern   Not on file  Social History Narrative   Not on file   Social Determinants of Health   Financial Resource Strain: Not on file  Food Insecurity: Not on file  Transportation Needs: Not on file  Physical Activity: Not on file  Stress: Not on file  Social Connections: Not on file  Intimate Partner Violence: Not on file      PHYSICAL EXAM Generalized: Well developed, in no acute distress   Neurological examination  Mentation: Alert oriented to time, place, history taking. Follows all commands speech and  language fluent Cranial nerve II-XII:Extraocular movements were full. Facial symmetry noted.  Head turning and shoulder shrug  were normal and symmetric. Gait and station: Patient is able to stand from a seated position.  Reflexes: UTA  DIAGNOSTIC DATA (LABS, IMAGING, TESTING) - I reviewed patient records, labs, notes, testing and imaging myself where available.  Lab Results  Component Value Date   WBC 8.6 04/29/2017   HGB 14.2 04/29/2017   HCT 42.3 04/29/2017   MCV 86.2 04/29/2017   PLT 409 04/29/2017      Component Value Date/Time   NA 137 04/29/2017 1819   NA 140 05/13/2016 1452   K 3.9 04/29/2017 1819   CL 101 04/29/2017 1819  CO2 26 04/29/2017 1819   GLUCOSE 91 04/29/2017 1819   BUN 8 04/29/2017 1819   BUN 11 05/13/2016 1452   CREATININE 0.64 04/29/2017 1819   CALCIUM 9.5 04/29/2017 1819   PROT 7.4 05/13/2016 1452   ALBUMIN 4.4 05/13/2016 1452   AST 13 05/13/2016 1452   ALT 12 05/13/2016 1452   ALKPHOS 99 05/13/2016 1452   BILITOT 0.4 05/13/2016 1452   GFRNONAA >60 04/29/2017 1819   GFRAA >60 04/29/2017 1819   Lab Results  Component Value Date   CHOL 264 (H) 05/13/2016   HDL 43 05/13/2016   LDLCALC 152 (H) 05/13/2016   TRIG 346 (H) 05/13/2016   CHOLHDL 6.1 (H) 05/13/2016   Lab Results  Component Value Date   HGBA1C 5.2 05/13/2016   No results found for: "VITAMINB12" Lab Results  Component Value Date   TSH 3.430 05/13/2016      ASSESSMENT AND PLAN 56 y.o. year old female  has a past medical history of Anxiety, Arthritis, Atypical face pain, Chronic insomnia (03/09/2020), Common migraine with intractable migraine (03/09/2020), Hyperlipidemia, Irregular bleeding, Scoliosis, Sleep disturbance, Vertigo, and Vitamin D deficiency. here with:  1.  Migraine headaches  --Continue nortriptyline 30 mg at bedtime --Continue Maxalt for abortive therapy --Follow-up in 1 year or sooner if needed      Ward Givens, MSN, NP-C 12/24/2021, 11:33 AM [facility] [address], [place], [place] [address] [phone]

## 2022-01-02 ENCOUNTER — Other Ambulatory Visit
Admission: RE | Admit: 2022-01-02 | Discharge: 2022-01-02 | Disposition: A | Discharge status: Home / Self Care | Payer: BC Managed Care – PPO | Source: Ambulatory Visit | Attending: Student | Admitting: Student

## 2022-01-02 DIAGNOSIS — R0789 Other chest pain: Secondary | ICD-10-CM | POA: Diagnosis present

## 2022-01-02 LAB — TROPONIN I (HIGH SENSITIVITY): Troponin I (High Sensitivity): 3 ng/L (ref <18)

## 2022-01-02 LAB — CBC
HCT: 42.1 % (ref 36.0–46.0)
Hemoglobin: 13.6 g/dL (ref 12.0–15.0)
MCH: 27.8 pg (ref 26.0–34.0)
MCHC: 32.3 g/dL (ref 30.0–36.0)
MCV: 86.1 fL (ref 80.0–100.0)
Platelets: 452 10*3/uL — ABNORMAL HIGH (ref 150–400)
RBC: 4.89 MIL/uL (ref 3.87–5.11)
RDW: 12.8 % (ref 11.5–15.5)
WBC: 12.9 10*3/uL — ABNORMAL HIGH (ref 4.0–10.5)
nRBC: 0 % (ref 0.0–0.2)

## 2022-01-03 ENCOUNTER — Other Ambulatory Visit
Admission: RE | Admit: 2022-01-03 | Discharge: 2022-01-03 | Disposition: A | Discharge status: Home / Self Care | Payer: BC Managed Care – PPO | Source: Ambulatory Visit | Attending: Student | Admitting: Student

## 2022-01-03 DIAGNOSIS — R0789 Other chest pain: Secondary | ICD-10-CM | POA: Insufficient documentation

## 2022-01-03 LAB — D-DIMER, QUANTITATIVE: D-Dimer, Quant: <0.27 ug/mL-FEU (ref 0.00–0.50)

## 2022-04-05 ENCOUNTER — Other Ambulatory Visit: Payer: Self-pay | Admitting: Adult Health

## 2022-04-30 ENCOUNTER — Telehealth: Payer: Self-pay | Admitting: Certified Nurse Midwife

## 2022-04-30 NOTE — Telephone Encounter (Signed)
Reached out to pt about Nov. 1 appt with Deneise Lever at 2:30.  Wanting to move appt to 1:35 with Deneise Lever on Nov. 1.  Left message for pt to call back about schedule change.

## 2022-05-01 NOTE — Telephone Encounter (Signed)
Pt rescheduled to 1:35 on Nov. 1 with Deneise Lever.  Pt aware of appt time change per SP.

## 2022-05-07 ENCOUNTER — Encounter: Payer: BC Managed Care – PPO | Admitting: Certified Nurse Midwife

## 2022-05-07 ENCOUNTER — Ambulatory Visit: Payer: BC Managed Care – PPO | Admitting: Certified Nurse Midwife

## 2022-06-24 ENCOUNTER — Ambulatory Visit: Payer: BC Managed Care – PPO | Admitting: Certified Nurse Midwife

## 2022-09-10 ENCOUNTER — Encounter: Payer: Self-pay | Admitting: Adult Health

## 2022-09-18 ENCOUNTER — Emergency Department: Payer: BC Managed Care – PPO

## 2022-09-18 ENCOUNTER — Other Ambulatory Visit: Payer: Self-pay

## 2022-09-18 ENCOUNTER — Emergency Department
Admission: EM | Admit: 2022-09-18 | Discharge: 2022-09-18 | Disposition: A | Discharge status: Home / Self Care | Payer: BC Managed Care – PPO | Attending: Emergency Medicine | Admitting: Emergency Medicine

## 2022-09-18 DIAGNOSIS — S0990XA Unspecified injury of head, initial encounter: Secondary | ICD-10-CM

## 2022-09-18 DIAGNOSIS — S060X0A Concussion without loss of consciousness, initial encounter: Secondary | ICD-10-CM | POA: Insufficient documentation

## 2022-09-18 DIAGNOSIS — W228XXA Striking against or struck by other objects, initial encounter: Secondary | ICD-10-CM | POA: Insufficient documentation

## 2022-09-18 MED ORDER — KETOROLAC TROMETHAMINE 15 MG/ML IJ SOLN: 15.0000 mg | Freq: Once | INTRAMUSCULAR | Status: DC

## 2022-09-18 MED ORDER — KETOROLAC TROMETHAMINE 15 MG/ML IJ SOLN
15.0000 mg | Freq: Once | INTRAMUSCULAR | Status: AC
  Administered 2022-09-18: 15 mg via INTRAMUSCULAR
  Filled 2022-09-18: qty 1

## 2022-09-18 MED ORDER — METOCLOPRAMIDE HCL 5 MG/ML IJ SOLN
10.0000 mg | Freq: Once | INTRAMUSCULAR | Status: DC
Start: 2022-09-18 — End: 2022-09-18

## 2022-09-18 MED ORDER — ONDANSETRON 4 MG PO TBDP
4.0000 mg | ORAL_TABLET | Freq: Once | ORAL | Status: AC
  Administered 2022-09-18: 4 mg via ORAL
  Filled 2022-09-18: qty 1

## 2022-09-18 MED ORDER — SODIUM CHLORIDE 0.9 % IV BOLUS: 500.0000 mL | Freq: Once | INTRAVENOUS | Status: DC

## 2022-09-18 NOTE — ED Triage Notes (Signed)
Pt reports she hit her head on the corner of kitchen cabinet 2 days ago on 09/16/2022. Pt reports felt nauseas after. Pt denies LOC. Pt reports still having ongoing HA, nausea and dizziness. Pt denies blood thinners. Pt ambulatory in triage.

## 2022-09-18 NOTE — Discharge Instructions (Addendum)
Your CT head and neck are normal.  Please return for any new, worsening, or change in symptoms or other concerns.  Try to practice brain rest as we discussed.  It was a pleasure caring for you today.

## 2022-09-18 NOTE — ED Notes (Signed)
First Nurse Note: Patient brought to ED from [facility] for head injury 2 days ago. Has nausea, HA, dizziness, and photosensitivity.

## 2022-09-18 NOTE — ED Provider Notes (Signed)
[facility] Provider Note    Event Date/Time   First MD Initiated Contact with Patient 09/18/22 1248     (approximate)   History   Head Injury   HPI  Angela Wu is a 57 y.o. female with a past medical history of obesity, hyperlipidemia who presents today for evaluation of head injury.  Patient reports that 2 days ago she stood up and hit her head on the corner of a cabinet.  She reports that she felt very dizzy when it happened, but the dizziness has resolved.  She reports that she has had headaches since her injury.  She has been taking Tylenol and ibuprofen though does not feel like this is helping.  She has not had any weakness or paresthesias.  She has been able to ambulate.  She has felt slightly nauseated, though has not had any vomiting.  She reports that she has very mild light sensitivity.  Patient Active Problem List   Diagnosis Date Noted   Family history of cancer    Polyp of transverse colon    Family history of colon cancer in mother 03/14/2020   Common migraine with intractable migraine 03/09/2020   Chronic insomnia 03/09/2020   Obesity 12/25/2017   Vitamin D deficiency 05/16/2016   Hyperlipidemia 05/16/2016   Pain in left knee 12/17/2011          Physical Exam   Triage Vital Signs: ED Triage Vitals  Enc Vitals Group     BP 09/18/22 1240 (!) 140/91     Pulse Rate 09/18/22 1240 94     Resp 09/18/22 1240 18     Temp 09/18/22 1240 98.6 F (37 C)     Temp Source 09/18/22 1240 Oral     SpO2 09/18/22 1240 96 %     Weight --      Height --      Head Circumference --      Peak Flow --      Pain Score 09/18/22 1241 4     Pain Loc --      Pain Edu? --      Excl. in [place]? --     Most recent vital signs: Vitals:   09/18/22 1240  BP: (!) 140/91  Pulse: 94  Resp: 18  Temp: 98.6 F (37 C)  SpO2: 96%    Physical Exam Vitals and nursing note reviewed.  Constitutional:      General: Awake and alert. No acute distress.     Appearance: Normal appearance. The patient is normal weight.  HENT:     Head: Normocephalic and atraumatic.  No hematoma noted.  No scalp laceration noted.  No Battle sign or raccoon eyes.    Mouth: Mucous membranes are moist.  Eyes:     General: PERRL. Normal EOMs        Right eye: No discharge.        Left eye: No discharge.     Conjunctiva/sclera: Conjunctivae normal.  Cardiovascular:     Rate and Rhythm: Normal rate and regular rhythm.     Pulses: Normal pulses.  Pulmonary:     Effort: Pulmonary effort is normal. No respiratory distress.     Breath sounds: Normal breath sounds.  Abdominal:     Abdomen is soft. There is no abdominal tenderness. No rebound or guarding. No distention. Musculoskeletal:        General: No swelling. Normal range of motion.     Cervical back: Normal range  of motion and neck supple.  No midline cervical spine tenderness.  Full range of motion of neck.  Negative Spurling test.  Negative Lhermitte sign.  Normal strength and sensation in bilateral upper extremities. Normal grip strength bilaterally.  Normal intrinsic muscle function of the hand bilaterally.  Normal radial pulses bilaterally. Skin:    General: Skin is warm and dry.     Capillary Refill: Capillary refill takes less than 2 seconds.     Findings: No rash.  Neurological:     Mental Status: The patient is awake and alert.   Neurological: GCS 15 alert and oriented x3 Normal speech, no expressive or receptive aphasia or dysarthria Cranial nerves II through XII intact Normal visual fields 5 out of 5 strength in all 4 extremities with intact sensation throughout No extremity drift Normal finger-to-nose testing, no limb or truncal ataxia    ED Results / Procedures / Treatments   Labs (all labs ordered are listed, but only abnormal results are displayed) Labs Reviewed - No data to display   EKG     RADIOLOGY     PROCEDURES:  Critical Care performed:    Procedures   MEDICATIONS ORDERED IN ED: Medications  sodium chloride 0.9 % bolus 500 mL (has no administration in time range)  ketorolac (TORADOL) 15 MG/ML injection 15 mg (15 mg Intramuscular Given 09/18/22 1330)  ondansetron (ZOFRAN-ODT) disintegrating tablet 4 mg (4 mg Oral Given 09/18/22 1330)     IMPRESSION / MDM / ASSESSMENT AND PLAN / ED COURSE  I reviewed the triage vital signs and the nursing notes.   Differential diagnosis includes, but is not limited to, concussion, contusion, less likely intracranial hemorrhage or skull fracture.  Patient is awake and alert, hemodynamically stable and neurologically intact.   I reviewed the patient's chart.  Patient was seen in the office for head injury associated with dizziness, nausea, photosensitivity, and they sent her to the emergency department for CT scan.  CT demonstrates a partially empty sella turcica which can reflect incidental anatomic variation or associated with idiopathic intracranial hypertension.  Patient did not have any pain prior to hitting her head, and she has no visual changes.  Recommended outpatient follow-up with neurology/ophthalmology if her symptoms persist.  Patient is quite well in appearance.  No neurological deficits or midline spinal tenderness. Not encephalopathic, overall well-appearing. Physical examination and imaging reassuring against urgent or emergent traumatic process.  CT scans obtained per urgent care, though she does not meet criteria for [place] criteria.  These were negative for acute findings.  She requested analgesia.  Original plan for headache cocktail with Toradol, Reglan, fluids, however patient declined IV.  She was subsequently given IM Toradol, and p.o. Zofran.  This provided good relief.  Symptoms of headache, nausea, and light sensitivity may be consistent with concussion.  We discussed concussion precautions and brain rest.  She was given a work note.  Discussed care plan, return  precautions, and advised close outpatient follow-up. Patient agrees with plan of care.    Patient's presentation is most consistent with acute complicated illness / injury requiring diagnostic workup.   FINAL CLINICAL IMPRESSION(S) / ED DIAGNOSES   Final diagnoses:  Minor head injury, initial encounter  Concussion without loss of consciousness, initial encounter     Rx / DC Orders   ED Discharge Orders     None        Note:  This document was prepared using Dragon voice recognition software and may include unintentional dictation errors.  Emeline Gins 09/18/22 1416    Vanessa [place], MD 09/19/22 2159

## 2022-11-26 ENCOUNTER — Other Ambulatory Visit: Payer: Self-pay | Admitting: Internal Medicine

## 2022-11-26 DIAGNOSIS — E782 Mixed hyperlipidemia: Secondary | ICD-10-CM

## 2022-11-26 DIAGNOSIS — I70209 Unspecified atherosclerosis of native arteries of extremities, unspecified extremity: Secondary | ICD-10-CM

## 2022-11-27 ENCOUNTER — Ambulatory Visit
Admission: RE | Admit: 2022-11-27 | Discharge: 2022-11-27 | Disposition: A | Discharge status: Home / Self Care | Payer: Self-pay | Source: Ambulatory Visit | Attending: Internal Medicine | Admitting: Internal Medicine

## 2022-11-27 DIAGNOSIS — I70209 Unspecified atherosclerosis of native arteries of extremities, unspecified extremity: Secondary | ICD-10-CM | POA: Insufficient documentation

## 2022-11-27 DIAGNOSIS — E782 Mixed hyperlipidemia: Secondary | ICD-10-CM | POA: Insufficient documentation

## 2022-12-23 ENCOUNTER — Telehealth (INDEPENDENT_AMBULATORY_CARE_PROVIDER_SITE_OTHER): Payer: BC Managed Care – PPO | Admitting: Adult Health

## 2022-12-23 DIAGNOSIS — G43009 Migraine without aura, not intractable, without status migrainosus: Secondary | ICD-10-CM | POA: Diagnosis not present

## 2022-12-23 MED ORDER — NORTRIPTYLINE HCL 10 MG PO CAPS: ORAL_CAPSULE | ORAL | 2 refills | Status: AC

## 2022-12-23 MED ORDER — RIZATRIPTAN BENZOATE 10 MG PO TABS: ORAL_TABLET | ORAL | 3 refills | Status: DC

## 2022-12-23 NOTE — Patient Instructions (Signed)
Continue nortriptyline Take Maxalt at the onset of a migraine can repeat in 2 hours if needed  Taking nortriptyline and Zoloft together increases risk for serotonin syndrome.  This is a rare but serious condition.  If you develop any of the symptoms you should go to the emergency room  Serotonin Syndrome Serotonin is a chemical that helps to control several functions in the body. This chemical is also called a neurotransmitter. It controls: Brain and nerve cell function. Mood and emotions. Memory. Eating. Sleeping. Sexual activity. Stress response. Having too much serotonin in your body can cause serotonin syndrome. This condition can be harmful to your brain and nerve cells. This can be a life-threatening condition. What are the causes? This condition may be caused by taking medicines or drugs that increase the level of serotonin in your body, such as: Antidepressant medicines. Migraine medicines. Certain pain medicines. Certain drugs, including ecstasy, LSD, cocaine, and amphetamines. Over-the-counter cough or cold medicines that contain dextromethorphan. Certain herbal supplements, including St. John's wort, ginseng, and nutmeg. This condition usually occurs when you take these medicines or drugs together, but it can also happen with a high dose of a single medicine or drug. What increases the risk? You are more likely to develop this condition if: You just started taking a medicine or drug that increases the level of serotonin in the body. You recently increased the dose of a medicine or drug that increases the level of serotonin in the body. You take more than one medicine or drug that increases the level of serotonin in the body. What are the signs or symptoms? Symptoms of this condition usually start within several hours of taking a medicine or drug. Symptoms may be mild or severe. Mild symptoms include: Sweating. Restlessness or agitation. Muscle twitching or  stiffness. Rapid heart rate. Nausea, vomiting, or diarrhea. Shivering or goose bumps. Confusion. Severe symptoms include: Irregular heartbeat. Seizures. Loss of consciousness. High fever. How is this diagnosed? This condition may be diagnosed based on: Your medical history. A physical exam. Your prior use of drugs and medicines. Blood or urine tests. These may be used to rule out other causes of your symptoms. How is this treated? The treatment for this condition depends on the severity of your symptoms. For mild cases, stopping the medicine or drug that caused your condition is usually all that is needed. For moderate to severe cases, treatment in a hospital may be needed to prevent or treat life-threatening symptoms. Treatment may include: Medicines to control your symptoms. IV fluids. Actions to support your breathing. Treatments to control your body temperature. Follow these instructions at home: Medicines  Take over-the-counter and prescription medicines only as told by your health care provider. Check with your health care provider before you start taking any new prescriptions, over-the-counter medicines, herbs, or supplements. Do not combine any medicines that can cause this condition. Lifestyle  Maintain a healthy lifestyle. Eat a healthy diet that includes plenty of vegetables, fruits, whole grains, low-fat dairy products, and lean protein. Do not eat a lot of foods that are high in fat, added sugars, or salt. Get the right amount and quality of sleep. Most adults need 7-9 hours of sleep each night. Make time to exercise, even if it is only for short periods of time. Most adults should exercise for at least 150 minutes each week. Do not drink alcohol. Do not use illegal drugs. Do not take medicines for reasons other than they are prescribed. General instructions Do not use  any products that contain nicotine or tobacco. These products include cigarettes, chewing  tobacco, and vaping devices, such as e-cigarettes. If you need help quitting, ask your health care provider. Contact a health care provider if: Your symptoms do not improve or they get worse. Get help right away if: You have worsening confusion, severe headache, chest pain, high fever, seizures, or loss of consciousness. You experience serious side effects of medicine, such as swelling of your face, lips, tongue, or throat. These symptoms may be an emergency. Get help right away. Call 911. Do not wait to see if the symptoms will go away. Do not drive yourself to the hospital. Also, get help right away if: You have serious thoughts about hurting yourself or others. Take one of these steps if you feel like you may hurt yourself or others, or have thoughts about taking your own life: Go to your nearest emergency room. Call 911. Call the National Suicide Prevention Lifeline at (754)433-1782 or 988. This is open 24 hours a day. Text the Crisis Text Line at 639-667-0322. Summary Serotonin is a chemical that helps to control several functions in the body. High levels of serotonin in the body can cause serotonin syndrome, which can be life-threatening. This condition may be caused by taking medicines or drugs that increase the level of serotonin in your body. Treatment depends on the severity of your symptoms. For mild cases, stopping the medicine or drug that caused your condition is usually all that is needed. Check with your health care provider before you start taking any new prescriptions, over-the-counter medicines, herbs, or supplements. This information is not intended to replace advice given to you by your health care provider. Make sure you discuss any questions you have with your health care provider. Document Revised: 09/12/2021 Document Reviewed: 09/12/2021 Elsevier Patient Education  2024 [employer].

## 2022-12-23 NOTE — Progress Notes (Signed)
PATIENT: Angela Wu DOB: 1965/09/21  REASON FOR VISIT: follow up HISTORY FROM: patient  Virtual Visit via Video Note  I connected with Mortimer Fries on 12/23/22 at  1:15 PM EDT by a video enabled telemedicine application located remotely at [facility] and verified that I am speaking with the correct person using two identifiers who was located at their own home.   I discussed the limitations of evaluation and management by telemedicine and the availability of in person appointments. The patient expressed understanding and agreed to proceed.   PATIENT: Angela Wu DOB: 03-10-1966  REASON FOR VISIT: follow up HISTORY FROM: patient Primary neurologist: Transition to Dr. Daisy Blossom  HISTORY OF PRESENT ILLNESS: Today 12/23/22:  Angela Wu is a 57 y.o. female with a history of mirgraine headaches. Returns today for follow-up. Headaches have been off and on. Not sure how many she is having- maybe 1 every other week.  She typically tries to take over-the-counter medication first but will then use the Maxalt if the headache does not resolve.  She remains on nortriptyline 30 mg at bedtime.  She reports that she is also on Zoloft and her PCP recently increase this to 100 mg due to anxiety.  She returns today for an evaluation.    12/24/21: Angela Wu is a 57 year old female with a history of migraine headaches.  She returns today for follow-up.  She reports that it is rare for her to have a headache. She does have headache today but relates that to stress from work. Uses maxalt only for severe headache. Uses OTC medication for mild headaches.  Overall she feels that she is doing well.  Continues on nortriptyline 30 mg at bedtime  05/30/20:Angela Wu is a 57 year old female with a history of chronic migraine headaches.  She returns today for follow-up.  She states that she takes nortriptyline nightly she typically does not have a headache now.  She has been taking 30 mg at  bedtime.  She reports that she does get a headache she typically can take Maxalt and the headache resolves fairly quickly.  Overall she is doing well.  She joins me for a virtual visit.  HISTORY 05/30/2020 SS: Angela Wu is a 57 year old female with history of chronic migraine headache.  Currently taking nortriptyline 30 mg at bedtime, headaches are 50% better, is tolerating well.  Unclear if benefit with sleep, lifelong insomnia.  Has yet to take Maxalt.  For acute headache, will take ibuprofen or Tylenol with good benefit.  Has currently been dealing with prolonged URI/sinus issues, hard to accurately judge headaches, but overall thinks is improved.  Works from home, does not miss work due to headaches, works for [employer].  Has 2 grown children. Presents today for evaluation via virtual visit    REVIEW OF SYSTEMS: Out of a complete 14 system review of symptoms, the patient complains only of the following symptoms, and all other reviewed systems are negative.  See HPI  ALLERGIES: Allergies  Allergen Reactions   Amoxicillin Rash    HOME MEDICATIONS: Outpatient Medications Prior to Visit  Medication Sig Dispense Refill   Ascorbic Acid (VITAMIN C PO) Take by mouth.     atorvastatin (LIPITOR) 20 MG tablet Take 1 tablet by mouth daily.     Cyanocobalamin (VITAMIN B12 PO) Take by mouth.     dicyclomine (BENTYL) 10 MG capsule Take by mouth.     ibuprofen (ADVIL,MOTRIN) 800 MG tablet Take 1 tablet (800 mg  total) by mouth every 8 (eight) hours as needed. 30 tablet 0   JUNEL FE 1.5/30 1.5-30 MG-MCG tablet TAKE 1 TABLET BY MOUTH EVERY DAY 84 tablet 3   Multiple Vitamins-Minerals (ZINC PO) Take by mouth.     nortriptyline (PAMELOR) 10 MG capsule TAKE 3 CAPSULES BY MOUTH EVERY DAY AT BEDTIME 270 capsule 2   pantoprazole (PROTONIX) 20 MG tablet Take 20 mg by mouth 2 (two) times daily.     rizatriptan (MAXALT) 10 MG tablet Take 1 tablet at the onset of migraine. Can repeat in 2 hours if needed. 2  tabs/24hours 10 tablet 3   sertraline (ZOLOFT) 50 MG tablet TAKE 1 TABLET BY MOUTH EVERY DAY 90 tablet 1   Vitamin D, Ergocalciferol, (DRISDOL) 1.25 MG (50000 UNIT) CAPS capsule TAKE 1 CAPSULE (50,000 UNITS TOTAL) BY MOUTH EVERY 7 (SEVEN) DAYS 12 capsule 27   No facility-administered medications prior to visit.    PAST MEDICAL HISTORY: Past Medical History:  Diagnosis Date   Anxiety    Arthritis    hands   Atypical face pain    Chronic insomnia 03/09/2020   Common migraine with intractable migraine 03/09/2020   Hyperlipidemia    Irregular bleeding    Scoliosis    Dx as child. no current issues   Sleep disturbance    Vertigo    no episodes approx 6 mos   Vitamin D deficiency     PAST SURGICAL HISTORY: Past Surgical History:  Procedure Laterality Date   CESAREAN SECTION     COLONOSCOPY WITH PROPOFOL N/A 01/27/2019   Procedure: COLONOSCOPY WITH PROPOFOL;  Surgeon: Pasty Spillers, MD;  Location: [facility] ENDOSCOPY;  Service: Endoscopy;  Laterality: N/A;   COLONOSCOPY WITH PROPOFOL N/A 05/10/2020   Procedure: COLONOSCOPY WITH PROPOFOL;  Surgeon: Pasty Spillers, MD;  Location: [facility] ENDOSCOPY;  Service: Endoscopy;  Laterality: N/A;   KNEE SURGERY Left    fractured knee cap    FAMILY HISTORY: Family History  Problem Relation Age of Onset   Cancer Mother    Heart disease Father    Heart attack Father    Rectal cancer Father    Breast cancer Paternal Grandmother     SOCIAL HISTORY: Social History   Socioeconomic History   Marital status: Divorced    Spouse name: Not on file   Number of children: Not on file   Years of education: Not on file   Highest education level: Not on file  Occupational History   Not on file  Tobacco Use   Smoking status: Never   Smokeless tobacco: Never  Vaping Use   Vaping Use: Never used  Substance and Sexual Activity   Alcohol use: Yes    Comment: rarely   Drug use: No   Sexual activity: Not Currently    Birth control/protection:  Pill  Other Topics Concern   Not on file  Social History Narrative   Not on file   Social Determinants of Health   Financial Resource Strain: Not on file  Food Insecurity: Not on file  Transportation Needs: Not on file  Physical Activity: Not on file  Stress: Not on file  Social Connections: Not on file  Intimate Partner Violence: Not on file      PHYSICAL EXAM Generalized: Well developed, in no acute distress   Neurological examination  Mentation: Alert oriented to time, place, history taking. Follows all commands speech and language fluent Cranial nerve II-XII: Facial symmetry noted twitching noted in the right eye however  patient advised that this is due to her glasses.  Soon as she took her glasses off the twitching stopped.  Reflexes: UTA  DIAGNOSTIC DATA (LABS, IMAGING, TESTING) - I reviewed patient records, labs, notes, testing and imaging myself where available.  Lab Results  Component Value Date   WBC 12.9 (H) 01/02/2022   HGB 13.6 01/02/2022   HCT 42.1 01/02/2022   MCV 86.1 01/02/2022   PLT 452 (H) 01/02/2022      Component Value Date/Time   NA 137 04/29/2017 1819   NA 140 05/13/2016 1452   K 3.9 04/29/2017 1819   CL 101 04/29/2017 1819   CO2 26 04/29/2017 1819   GLUCOSE 91 04/29/2017 1819   BUN 8 04/29/2017 1819   BUN 11 05/13/2016 1452   CREATININE 0.64 04/29/2017 1819   CALCIUM 9.5 04/29/2017 1819   PROT 7.4 05/13/2016 1452   ALBUMIN 4.4 05/13/2016 1452   AST 13 05/13/2016 1452   ALT 12 05/13/2016 1452   ALKPHOS 99 05/13/2016 1452   BILITOT 0.4 05/13/2016 1452   GFRNONAA >60 04/29/2017 1819   GFRAA >60 04/29/2017 1819   Lab Results  Component Value Date   CHOL 264 (H) 05/13/2016   HDL 43 05/13/2016   LDLCALC 152 (H) 05/13/2016   TRIG 346 (H) 05/13/2016   CHOLHDL 6.1 (H) 05/13/2016   Lab Results  Component Value Date   HGBA1C 5.2 05/13/2016   No results found for: "VITAMINB12" Lab Results  Component Value Date   TSH 3.430  05/13/2016      ASSESSMENT AND PLAN 57 y.o. year old female  has a past medical history of Anxiety, Arthritis, Atypical face pain, Chronic insomnia (03/09/2020), Common migraine with intractable migraine (03/09/2020), Hyperlipidemia, Irregular bleeding, Scoliosis, Sleep disturbance, Vertigo, and Vitamin D deficiency. here with:  1.  Migraine headaches  --Continue nortriptyline 30 mg at bedtime --Continue Maxalt for abortive therapy.  Advised to take at the onset of a migraine and repeat in 2 hours if needed --Did caution the patient of serotonin syndrome when taking nortriptyline and Zoloft together. --Follow-up in 7 months or sooner if needed      Butch Penny, MSN, NP-C 12/23/2022, 1:03 PM [facility] [address] [place], [place] [address] [phone]

## 2023-02-09 IMAGING — MG MM DIGITAL SCREENING BILAT W/ TOMO AND CAD
8 series · 8 of 24 positions shown · non-contrast
Comparison: Previous exam(s).

CLINICAL DATA: Screening.

EXAM:
DIGITAL SCREENING BILATERAL MAMMOGRAM WITH TOMOSYNTHESIS AND CAD
TECHNIQUE: Bilateral screening digital craniocaudal and mediolateral oblique
mammograms were obtained. Bilateral screening digital breast
tomosynthesis was performed. The images were evaluated with
computer-aided detection.

[R CC synth-2D]
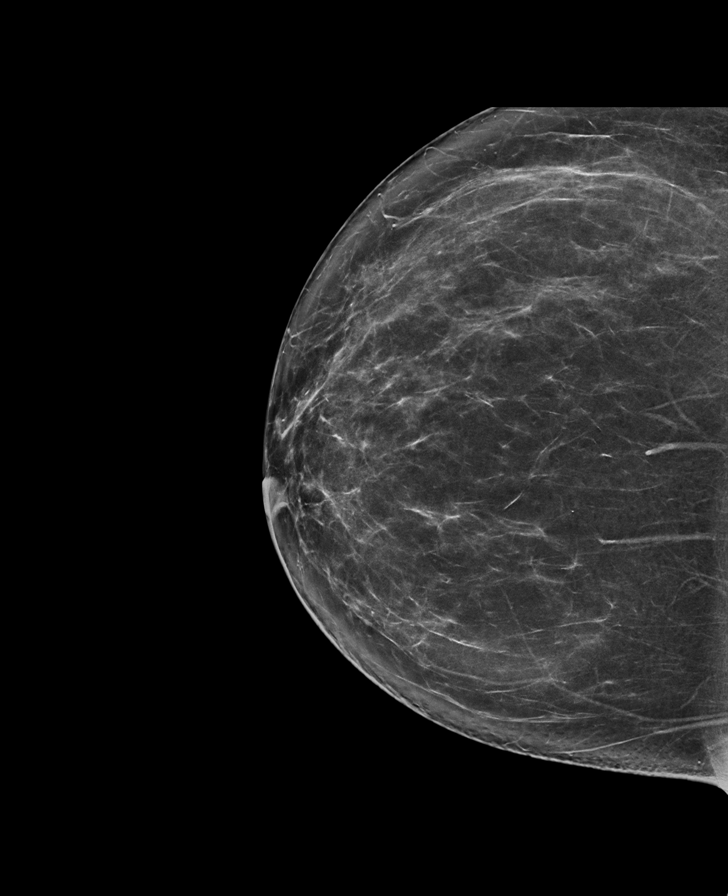

[L MLO synth-2D]
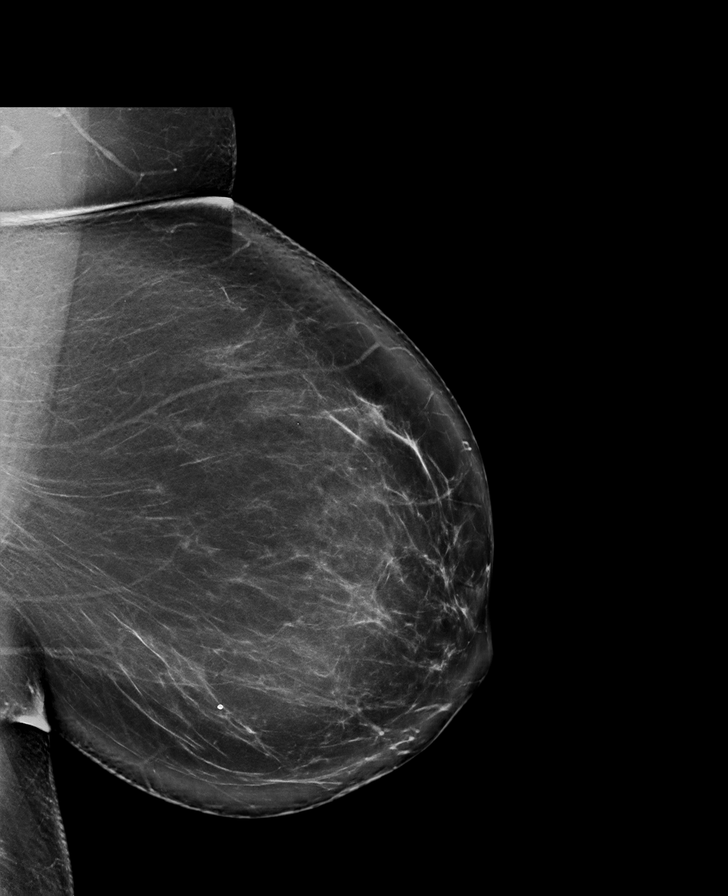

[R MLO synth-2D]
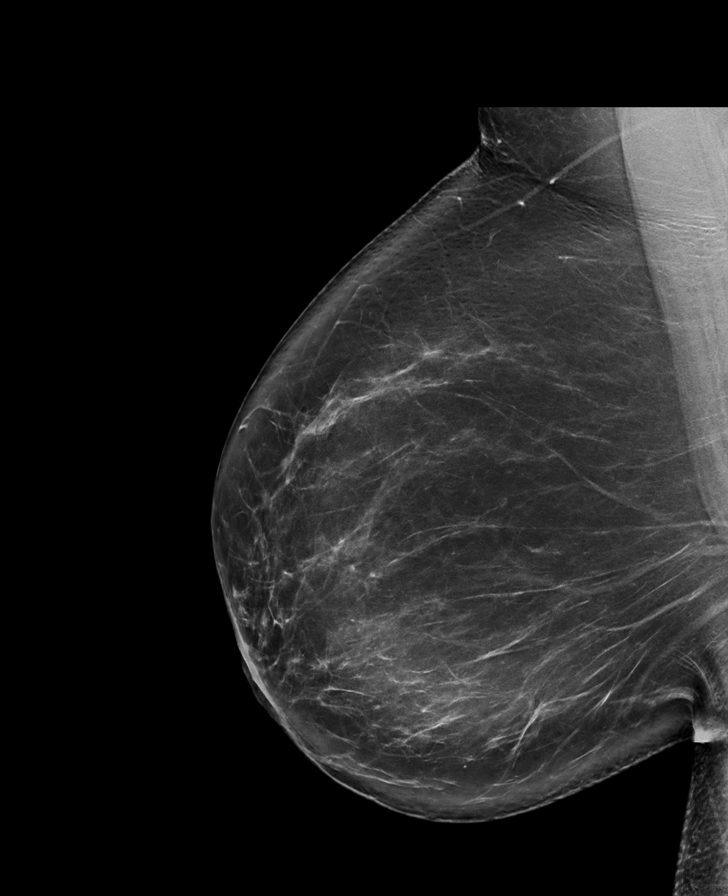

[L CC synth-2D]
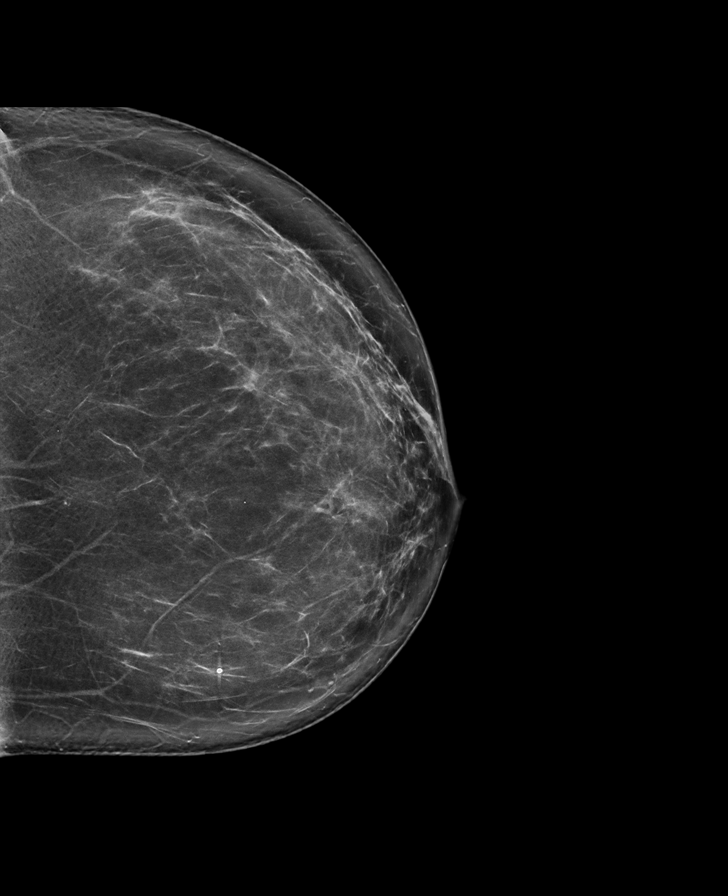

[R CC tomo · tomo slice 41/82.0]
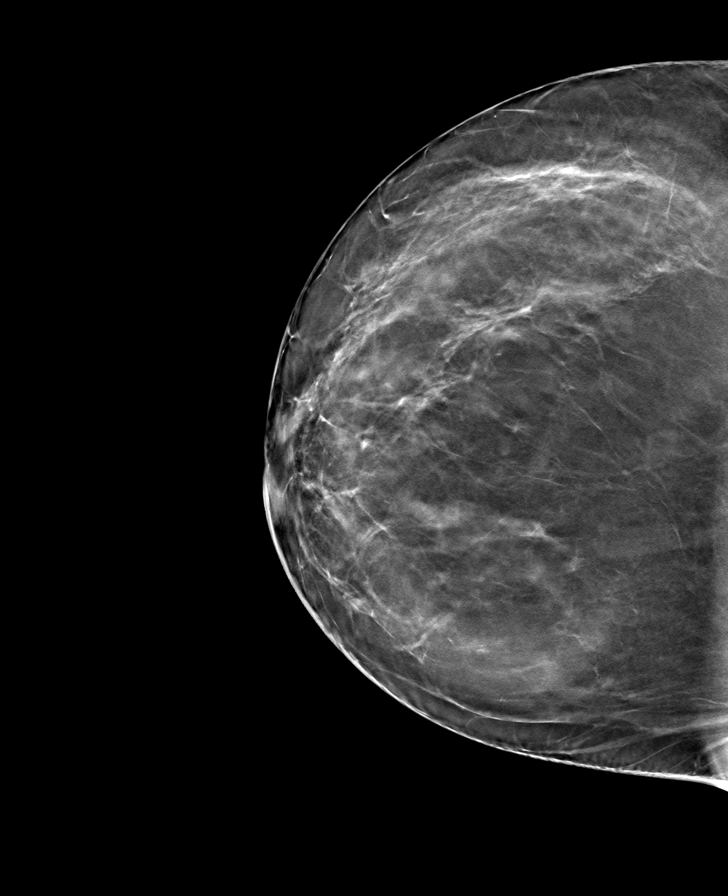

[R MLO tomo · tomo slice 49/97.0]
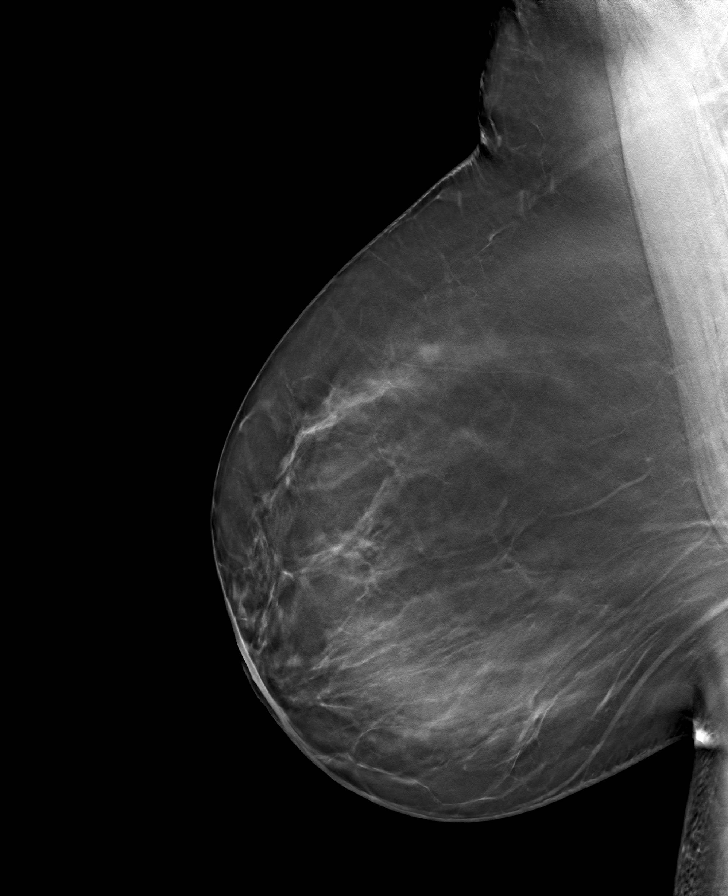

[L MLO tomo · tomo slice 51/101.0]
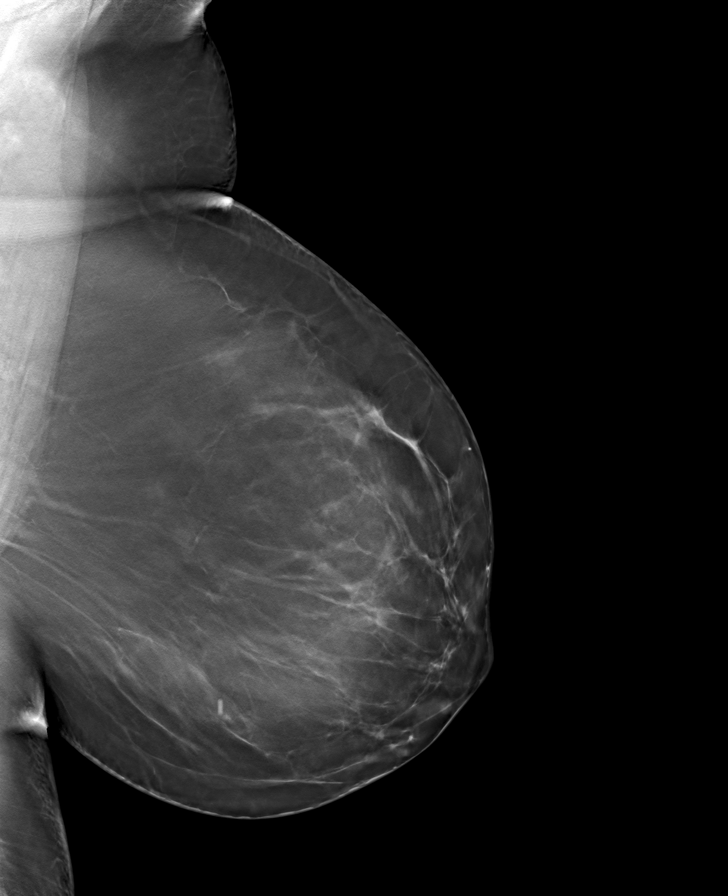

[L CC tomo · tomo slice 43/84.0]
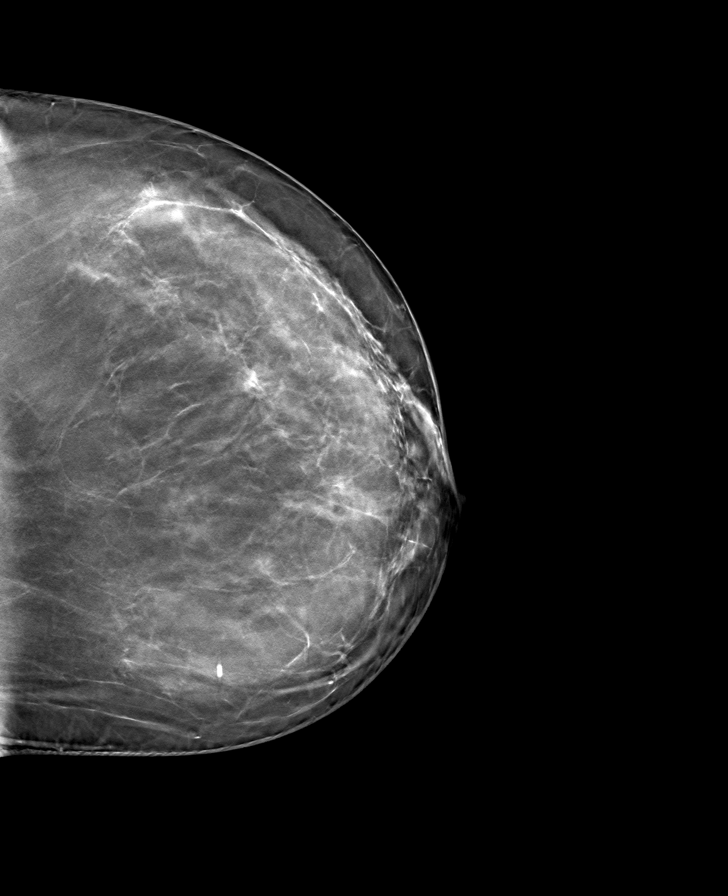

[8 of 24 positions shown; findings below may reference images not displayed]

ACR Breast Density Category b: There are scattered areas of
fibroglandular density.
FINDINGS: There are no findings suspicious for malignancy.
IMPRESSION: No mammographic evidence of malignancy. A result letter of this
screening mammogram will be mailed directly to the patient.

RECOMMENDATION:
Screening mammogram in one year. (Code:51-O-LD2)

BI-RADS CATEGORY  1: Negative.

## 2023-02-24 ENCOUNTER — Other Ambulatory Visit: Payer: Self-pay | Admitting: Physician Assistant

## 2023-02-24 DIAGNOSIS — H539 Unspecified visual disturbance: Secondary | ICD-10-CM

## 2023-02-24 DIAGNOSIS — E348 Other specified endocrine disorders: Secondary | ICD-10-CM

## 2023-02-24 DIAGNOSIS — D72829 Elevated white blood cell count, unspecified: Secondary | ICD-10-CM

## 2023-02-24 DIAGNOSIS — R519 Headache, unspecified: Secondary | ICD-10-CM

## 2023-03-10 ENCOUNTER — Other Ambulatory Visit: Payer: Self-pay

## 2023-04-07 ENCOUNTER — Other Ambulatory Visit: Payer: Self-pay | Admitting: Internal Medicine

## 2023-04-07 DIAGNOSIS — Z1231 Encounter for screening mammogram for malignant neoplasm of breast: Secondary | ICD-10-CM

## 2023-04-17 ENCOUNTER — Ambulatory Visit
Admission: RE | Admit: 2023-04-17 | Discharge: 2023-04-17 | Disposition: A | Discharge status: Home / Self Care | Payer: BC Managed Care – PPO | Source: Ambulatory Visit | Attending: Internal Medicine | Admitting: Internal Medicine

## 2023-04-17 DIAGNOSIS — Z1231 Encounter for screening mammogram for malignant neoplasm of breast: Secondary | ICD-10-CM | POA: Diagnosis present

## 2023-07-20 ENCOUNTER — Telehealth: Payer: Self-pay | Admitting: Adult Health

## 2023-07-20 NOTE — Telephone Encounter (Signed)
 Pt cancelling appointment due to have been seen already by neurologist and neurosurgeon. Would like to discontinue care with [facility]. Not anything [facility] has done, but was just in situation.

## 2023-07-24 ENCOUNTER — Telehealth: Payer: BC Managed Care – PPO | Admitting: Adult Health

## 2024-04-15 ENCOUNTER — Telehealth: Payer: Self-pay

## 2024-04-15 NOTE — Telephone Encounter (Signed)
 Medication refill denied due to patient not being seen since 2023 and now being seen at [facility].

## 2024-04-22 ENCOUNTER — Telehealth: Payer: Self-pay

## 2024-05-23 ENCOUNTER — Other Ambulatory Visit: Payer: Self-pay | Admitting: Obstetrics and Gynecology

## 2024-05-23 DIAGNOSIS — Z1231 Encounter for screening mammogram for malignant neoplasm of breast: Secondary | ICD-10-CM

## 2024-07-08 ENCOUNTER — Ambulatory Visit
Admission: RE | Admit: 2024-07-08 | Discharge: 2024-07-08 | Disposition: A | Discharge status: Home / Self Care | Source: Ambulatory Visit | Attending: Obstetrics and Gynecology | Admitting: Obstetrics and Gynecology

## 2024-07-08 DIAGNOSIS — Z1231 Encounter for screening mammogram for malignant neoplasm of breast: Secondary | ICD-10-CM | POA: Diagnosis present

## 2024-07-13 ENCOUNTER — Other Ambulatory Visit: Payer: Self-pay | Admitting: Medical Genetics

## 2024-07-22 ENCOUNTER — Ambulatory Visit: Payer: Self-pay | Admitting: Obstetrics and Gynecology

## 2024-07-29 ENCOUNTER — Other Ambulatory Visit

## 2024-08-02 ENCOUNTER — Encounter
Admission: RE | Admit: 2024-08-02 | Discharge: 2024-08-02 | Disposition: A | Discharge status: Home / Self Care | Source: Ambulatory Visit | Attending: Obstetrics and Gynecology | Admitting: Obstetrics and Gynecology

## 2024-08-02 ENCOUNTER — Other Ambulatory Visit: Payer: Self-pay

## 2024-08-02 NOTE — Patient Instructions (Addendum)
 Your procedure is scheduled on: Friday 08/05/24  Report to the Registration Desk on the 1st floor of the [facility]. To find out your arrival time, please call [phone] between 1PM - 3PM on: Thursday 08/04/24  If your arrival time is 6:00 am, do not arrive before that time as the [facility] entrance doors do not open until 6:00 am.  REMEMBER: Instructions that are not followed completely may result in serious medical risk, up to and including death; or upon the discretion of your surgeon and anesthesiologist your surgery may need to be rescheduled.  Do not eat food after midnight the night before surgery.  No gum chewing or hard candies.  You may however, drink CLEAR liquids up to 2 hours before you are scheduled to arrive for your surgery. Do not drink anything within 2 hours of your scheduled arrival time.  Clear liquids include: - water  - apple juice without pulp - black coffee or tea (Do NOT add milk or creamers to the coffee or tea) Do NOT drink anything that is not on this list.   One week prior to surgery: Stop Anti-inflammatories (NSAIDS) such as Advil , Aleve, Ibuprofen , Motrin , Naproxen, Naprosyn and Aspirin based products such as Excedrin, Goody's Powder, BC Powder. Stop ANY OVER THE COUNTER supplements until after surgery. Cyanocobalamin (VITAMIN B12  Multiple Vitamins-Minerals (EMERGEN-C IMMUNE PO)  Multiple Vitamins-Minerals (ZINC)  You may however, continue to take Tylenol  if needed for pain up until the day of surgery.   Continue taking all of your other prescription medications up until the day of surgery.  ON THE DAY OF SURGERY ONLY TAKE THESE MEDICATIONS WITH SIPS OF WATER:  sertraline  (ZOLOFT ) 50 MG pantoprazole (PROTONIX) 20 MG    No Alcohol for 24 hours before or after surgery.  No Smoking including e-cigarettes for 24 hours before surgery.  No chewable tobacco products for at least 6 hours before surgery.  No nicotine patches on the day of  surgery.  Do not use any recreational drugs for at least a week (preferably 2 weeks) before your surgery.  Please be advised that the combination of cocaine and anesthesia may have negative outcomes, up to and including death. If you test positive for cocaine, your surgery will be cancelled.  On the morning of surgery brush your teeth with toothpaste and water, you may rinse your mouth with mouthwash if you wish. Do not swallow any toothpaste or mouthwash.  Use CHG Soap or wipes as directed on instruction sheet.  Do not wear jewelry, make-up, hairpins, clips or nail polish.  For welded (permanent) jewelry: bracelets, anklets, waist bands, etc.  Please have this removed prior to surgery.  If it is not removed, there is a chance that hospital personnel will need to cut it off on the day of surgery.  Do not wear lotions, powders, or perfumes.   Do not shave body hair from the neck down 48 hours before surgery.  Contact lenses, hearing aids and dentures may not be worn into surgery.  Do not bring valuables to the hospital. [facility] is not responsible for any missing/lost belongings or valuables.    Notify your doctor if there is any change in your medical condition (cold, fever, infection).  Wear comfortable clothing (specific to your surgery type) to the hospital.  After surgery, you can help prevent lung complications by doing breathing exercises.  Take deep breaths and cough every 1-2 hours. Your doctor may order a device called an Facilities Manager to  help you take deep breaths. When coughing or sneezing, hold a pillow firmly against your incision with both hands. This is called splinting. Doing this helps protect your incision. It also decreases belly discomfort.  If you are being admitted to the hospital overnight, leave your suitcase in the car. After surgery it may be brought to your room.  In case of increased patient census, it may be necessary for you, the patient,  to continue your postoperative care in the Same Day Surgery department.  If you are being discharged the day of surgery, you will not be allowed to drive home. You will need a responsible individual to drive you home and stay with you for 24 hours after surgery.   If you are taking public transportation, you will need to have a responsible individual with you.  Please call the Pre-admissions Testing Dept. at [phone] if you have any questions about these instructions.  Surgery Visitation Policy:  Patients having surgery or a procedure may have two visitors.  Children under the age of 64 must have an adult with them who is not the patient.  Inpatient Visitation:    Visiting hours are 7 a.m. to 8 p.m. Up to four visitors are allowed at one time in a patient room. The visitors may rotate out with other people during the day.  One visitor age 80 or older may stay with the patient overnight and must be in the room by 8 p.m.   Merchandiser, Retail to address health-related social needs:  [email or web address]

## 2024-08-04 ENCOUNTER — Ambulatory Visit
Admission: RE | Admit: 2024-08-04 | Discharge: 2024-08-04 | Disposition: A | Discharge status: Home / Self Care | Attending: Obstetrics and Gynecology | Admitting: Obstetrics and Gynecology

## 2024-08-04 ENCOUNTER — Ambulatory Visit

## 2024-08-04 ENCOUNTER — Other Ambulatory Visit: Payer: Self-pay

## 2024-08-04 ENCOUNTER — Encounter: Payer: Self-pay | Admitting: Obstetrics and Gynecology

## 2024-08-04 DIAGNOSIS — N841 Polyp of cervix uteri: Secondary | ICD-10-CM | POA: Insufficient documentation

## 2024-08-04 DIAGNOSIS — N9489 Other specified conditions associated with female genital organs and menstrual cycle: Secondary | ICD-10-CM | POA: Diagnosis present

## 2024-08-04 DIAGNOSIS — N950 Postmenopausal bleeding: Secondary | ICD-10-CM | POA: Diagnosis present

## 2024-08-04 DIAGNOSIS — Z01818 Encounter for other preprocedural examination: Secondary | ICD-10-CM

## 2024-08-04 LAB — POCT PREGNANCY, URINE: Preg Test, Ur: NEGATIVE

## 2024-08-04 MED ORDER — PHENYLEPHRINE HCL-NACL 20-0.9 MG/250ML-% IV SOLN
INTRAVENOUS | Status: AC
  Filled 2024-08-04: qty 250

## 2024-08-04 MED ORDER — EPHEDRINE SULFATE-NACL 50-0.9 MG/10ML-% IV SOSY
PREFILLED_SYRINGE | INTRAVENOUS | Status: DC | PRN
  Administered 2024-08-04: 5 mg via INTRAVENOUS
  Administered 2024-08-04: 10 mg via INTRAVENOUS

## 2024-08-04 MED ORDER — MIDAZOLAM HCL 2 MG/2ML IJ SOLN
INTRAMUSCULAR | Status: AC
  Filled 2024-08-04: qty 2

## 2024-08-04 MED ORDER — ACETAMINOPHEN 10 MG/ML IV SOLN
INTRAVENOUS | Status: DC | PRN
  Administered 2024-08-04: 1000 mg via INTRAVENOUS

## 2024-08-04 MED ORDER — ACETAMINOPHEN 10 MG/ML IV SOLN
INTRAVENOUS | Status: AC
  Filled 2024-08-04: qty 100

## 2024-08-04 MED ORDER — CHLORHEXIDINE GLUCONATE 0.12 % MT SOLN
15.0000 mL | Freq: Once | OROMUCOSAL | Status: AC
  Administered 2024-08-04: 15 mL via OROMUCOSAL
  Filled 2024-08-04: qty 15

## 2024-08-04 MED ORDER — CHLORHEXIDINE GLUCONATE 0.12 % MT SOLN
OROMUCOSAL | Status: AC
  Filled 2024-08-04: qty 15

## 2024-08-04 MED ORDER — KETOROLAC TROMETHAMINE 30 MG/ML IJ SOLN
INTRAMUSCULAR | Status: DC | PRN
  Administered 2024-08-04: 30 mg via INTRAVENOUS

## 2024-08-04 MED ORDER — PROPOFOL 10 MG/ML IV BOLUS
INTRAVENOUS | Status: DC | PRN
  Administered 2024-08-04: 200 mg via INTRAVENOUS

## 2024-08-04 MED ORDER — LIDOCAINE HCL (PF) 2 % IJ SOLN
INTRAMUSCULAR | Status: AC
  Filled 2024-08-04: qty 5

## 2024-08-04 MED ORDER — ONDANSETRON HCL 4 MG/2ML IJ SOLN
INTRAMUSCULAR | Status: DC | PRN
  Administered 2024-08-04: 4 mg via INTRAVENOUS

## 2024-08-04 MED ORDER — PROPOFOL 10 MG/ML IV BOLUS
INTRAVENOUS | Status: AC
  Filled 2024-08-04: qty 40

## 2024-08-04 MED ORDER — FENTANYL CITRATE (PF) 100 MCG/2ML IJ SOLN
INTRAMUSCULAR | Status: AC
  Filled 2024-08-04: qty 2

## 2024-08-04 MED ORDER — LACTATED RINGERS IV SOLN
INTRAVENOUS | Status: AC
  Filled 2024-08-04: qty 1000

## 2024-08-04 MED ORDER — DEXAMETHASONE SOD PHOSPHATE PF 10 MG/ML IJ SOLN
INTRAMUSCULAR | Status: DC | PRN
  Administered 2024-08-04: 5 mg via INTRAVENOUS

## 2024-08-04 MED ORDER — SILVER NITRATE-POT NITRATE 75-25 % EX MISC
CUTANEOUS | Status: DC | PRN
  Administered 2024-08-04: 2 via TOPICAL

## 2024-08-04 MED ORDER — KETOROLAC TROMETHAMINE 30 MG/ML IJ SOLN
INTRAMUSCULAR | Status: AC
  Filled 2024-08-04: qty 1

## 2024-08-04 MED ORDER — LACTATED RINGERS IV SOLN
INTRAVENOUS | Status: DC
  Filled 2024-08-04: qty 1000

## 2024-08-04 MED ORDER — FENTANYL CITRATE (PF) 100 MCG/2ML IJ SOLN
INTRAMUSCULAR | Status: DC | PRN
  Administered 2024-08-04: 50 ug via INTRAVENOUS

## 2024-08-04 MED ORDER — MIDAZOLAM HCL (PF) 2 MG/2ML IJ SOLN
INTRAMUSCULAR | Status: DC | PRN
  Administered 2024-08-04: 2 mg via INTRAVENOUS

## 2024-08-04 MED ORDER — 0.9 % SODIUM CHLORIDE (POUR BTL) OPTIME
TOPICAL | Status: DC | PRN
  Administered 2024-08-04: 5805 mL

## 2024-08-04 MED ORDER — IBUPROFEN 600 MG PO TABS: 600.0000 mg | ORAL_TABLET | Freq: Four times a day (QID) | ORAL | 0 refills | Status: AC

## 2024-08-04 MED ORDER — ORAL CARE MOUTH RINSE
15.0000 mL | Freq: Once | OROMUCOSAL | Status: AC
  Filled 2024-08-04: qty 15

## 2024-08-04 MED ORDER — LIDOCAINE HCL (CARDIAC) PF 100 MG/5ML IV SOSY
PREFILLED_SYRINGE | INTRAVENOUS | Status: DC | PRN
  Administered 2024-08-04: 100 mg via INTRAVENOUS

## 2024-08-04 NOTE — Anesthesia Procedure Notes (Signed)
 Procedure Name: LMA Insertion Date/Time: 08/04/2024 11:46 AM  Performed by: Gillermo Spruce I, CRNAPre-anesthesia Checklist: Patient identified, Patient being monitored, Timeout performed, Emergency Drugs available and Suction available Patient Re-evaluated:Patient Re-evaluated prior to induction Oxygen Delivery Method: Circle system utilized Preoxygenation: Pre-oxygenation with 100% oxygen Induction Type: IV induction Ventilation: Mask ventilation without difficulty LMA: LMA inserted LMA Size: 4.0 Tube type: Oral Number of attempts: 1 Placement Confirmation: positive ETCO2 and breath sounds checked- equal and bilateral Tube secured with: Tape Dental Injury: Teeth and Oropharynx as per pre-operative assessment

## 2024-08-04 NOTE — Anesthesia Preprocedure Evaluation (Signed)
 Anesthesia Evaluation  Patient identified by MRN, date of birth, ID band Patient awake    Reviewed: Allergy & Precautions, H&P , NPO status , Patient's Chart, lab work & pertinent test results  Airway Mallampati: II  TM Distance: >3 FB Neck ROM: Full    Dental no notable dental hx.    Pulmonary neg pulmonary ROS   Pulmonary exam normal breath sounds clear to auscultation       Cardiovascular negative cardio ROS Normal cardiovascular exam Rhythm:Regular Rate:Normal     Neuro/Psych negative neurological ROS  negative psych ROS   GI/Hepatic negative GI ROS, Neg liver ROS,,,  Endo/Other  negative endocrine ROS    Renal/GU negative Renal ROS  negative genitourinary   Musculoskeletal negative musculoskeletal ROS (+)    Abdominal   Peds negative pediatric ROS (+)  Hematology negative hematology ROS (+)   Anesthesia Other Findings   Reproductive/Obstetrics negative OB ROS                             Anesthesia Physical Anesthesia Plan  ASA: 2  Anesthesia Plan: General   Post-op Pain Management:    Induction: Intravenous  PONV Risk Score and Plan:   Airway Management Planned:   Additional Equipment:   Intra-op Plan:   Post-operative Plan: Extubation in OR  Informed Consent: I have reviewed the patients History and Physical, chart, labs and discussed the procedure including the risks, benefits and alternatives for the proposed anesthesia with the patient or authorized representative who has indicated his/her understanding and acceptance.     Dental advisory given  Plan Discussed with: CRNA  Anesthesia Plan Comments:        Anesthesia Quick Evaluation

## 2024-08-04 NOTE — Transfer of Care (Signed)
 Immediate Anesthesia Transfer of Care Note  Patient: Angela Wu  Procedure(s) Performed: DILATATION AND CURETTAGE /HYSTEROSCOPY (Cervix) POLYPECTOMY, ENDOMETRIAL (Cervix)  Patient Location: PACU  Anesthesia Type:General  Level of Consciousness: drowsy and responds to stimulation  Airway & Oxygen Therapy: Patient Spontanous Breathing  Post-op Assessment: Report given to RN and Post -op Vital signs reviewed and stable  Post vital signs: stable  Last Vitals:  Vitals Value Taken Time  BP 125/70 08/04/24 12:42  Temp    Pulse 73 08/04/24 12:45  Resp 17 08/04/24 12:45  SpO2 98 % 08/04/24 12:45  Vitals shown include unfiled device data.  Last Pain:  Vitals:   08/04/24 0952  PainSc: 3          Complications: No notable events documented.

## 2024-08-04 NOTE — H&P (Signed)
 Preoperative History and Physical  Angela Wu is a 59 y.o. [id number] here for surgical management of postmenopausal bleeding and suspected endometrial polyp.   No significant preoperative concerns.  History of Present Illness: Angela Wu is a 59 year old female who presents with abnormal uterine bleeding and cramping.   She has been experiencing abnormal uterine bleeding and cramping. The bleeding has improved with medication, but cramping persists. She manages the bleeding with tampons and pads.   She is currently on estradiol and Junel, a combination of estrogen and progesterone, to manage her symptoms. Since May, she has tried various treatments, including different dosages and forms of estrogen, such as patches and low-dose estrogen pills, without resolution of her symptoms. She reports that an ultrasound was performed to further evaluate her symptoms.   Her family history includes uterine issues, with her sister having undergone a hysterectomy for precancerous conditions and two aunts with similar histories. Her mother passed away from stage four colorectal cancer, having never undergone a colonoscopy.   She is concerned about the timing of potential procedures due to an upcoming trip to [place] in February.   Ultrasound demonstrates the following findings Adnexa: no masses seen  Uterus: anteverted with endometrial stripe  15.0 mm Additional: possible polyp  Proposed surgery: Hysteroscopy, dilation and curettage, removal of abnormal tissue  Past Medical History:  Diagnosis Date   Anxiety    Arthritis    hands   Atypical face pain    Chronic insomnia 03/09/2020   Common migraine with intractable migraine 03/09/2020   Hyperlipidemia    Irregular bleeding    Scoliosis    Dx as child. no current issues   Sleep disturbance    Vertigo    no episodes approx 6 mos   Vitamin D  deficiency    Past Surgical History:  Procedure Laterality Date   CESAREAN SECTION     COLONOSCOPY WITH  PROPOFOL  N/A 01/27/2019   Procedure: COLONOSCOPY WITH PROPOFOL ;  Surgeon: Janalyn Keene NOVAK, MD;  Location: [facility] ENDOSCOPY;  Service: Endoscopy;  Laterality: N/A;   COLONOSCOPY WITH PROPOFOL  N/A 05/10/2020   Procedure: COLONOSCOPY WITH PROPOFOL ;  Surgeon: Janalyn Keene NOVAK, MD;  Location: [facility] ENDOSCOPY;  Service: Endoscopy;  Laterality: N/A;   KNEE SURGERY Left    fractured knee cap   OB History  Gravida Para Term Preterm AB Living  2 2 2   2   SAB IAB Ectopic Multiple Live Births      2    # Outcome Date GA Lbr Len/2nd Weight Sex Type Anes PTL Lv  2 Term 2002 [redacted]w[redacted]d  4139 g M VBAC  N LIV  1 Term 1999 [redacted]w[redacted]d  3827 g F CS-Unspec  N LIV  Patient denies any other pertinent gynecologic issues.   Medications Ordered Prior to Encounter[1] Allergies[2]  Social History:   reports that she has never smoked. She has never used smokeless tobacco. She reports current alcohol use. She reports that she does not use drugs.  Family History  Problem Relation Age of Onset   Cancer Mother    Heart disease Father    Heart attack Father    Rectal cancer Father    Breast cancer Paternal Grandmother     Review of Systems: Noncontributory  PHYSICAL EXAM: Blood pressure 128/79, pulse 87, temperature (!) 96.9 F (36.1 C), resp. rate 16, height 5' 5 (1.651 m), weight 85.3 kg, last menstrual period 07/04/2024, SpO2 97%. CONSTITUTIONAL: Well-developed, well-nourished female in no acute distress.  HENT:  Normocephalic, atraumatic, External right and left ear normal. Oropharynx is clear and moist EYES: Conjunctivae and EOM are normal. Pupils are equal, round, and reactive to light. No scleral icterus.  NECK: Normal range of motion, supple, no masses SKIN: Skin is warm and dry. No rash noted. Not diaphoretic. No erythema. No pallor. NEUROLGIC: Alert and oriented to person, place, and time. Normal reflexes, muscle tone coordination. No cranial nerve deficit noted. PSYCHIATRIC: Normal mood and affect.  Normal behavior. Normal judgment and thought content. CARDIOVASCULAR: Normal heart rate noted, regular rhythm RESPIRATORY: Effort and breath sounds normal, no problems with respiration noted ABDOMEN: Soft, nontender, nondistended. PELVIC: Deferred MUSCULOSKELETAL: Normal range of motion. No edema and no tenderness. 2+ distal pulses.  Labs: Results for orders placed or performed during the hospital encounter of 08/04/24 (from the past 2 weeks)  Pregnancy, urine POC   Collection Time: 08/04/24  9:37 AM  Result Value Ref Range   Preg Test, Ur NEGATIVE NEGATIVE    Assessment: Postmenopausal bleeding Endometrial lesion, suspected polyp  Plan: Patient will undergo surgical management with the above-noted procedure.   The risks of surgery were discussed in detail with the patient including but not limited to: bleeding which may require transfusion or reoperation; infection which may require antibiotics; injury to surrounding organs which may involve bowel, bladder, ureters ; need for additional procedures including laparoscopy or laparotomy; thromboembolic phenomenon, surgical site problems and other postoperative/anesthesia complications. Likelihood of success in alleviating the patient's condition was discussed. Routine postoperative instructions will be reviewed with the patient and her family in detail after surgery.  The patient concurred with the proposed plan, giving informed written consent for the surgery.  Patient has been NPO since last night she will remain NPO for procedure.  Anesthesia and OR aware.  Preoperative prophylactic antibiotics, as indicated, and SCDs ordered on call to the OR.  To OR when ready.  Garnette Mace, MD 08/04/2024 11:14 AM       [1]  No current facility-administered medications on file prior to encounter.   Current Outpatient Medications on File Prior to Encounter  Medication Sig Dispense Refill   ASPIRIN LOW DOSE 81 MG tablet Take 81 mg by mouth daily.      atorvastatin  (LIPITOR) 40 MG tablet Take 40 mg by mouth daily.     Cyanocobalamin (VITAMIN B12 PO) Take 1 tablet by mouth daily.     fluticasone (FLONASE) 50 MCG/ACT nasal spray Place 1 spray into both nostrils daily as needed for allergies.     JUNEL FE 1.5/30 1.5-30 MG-MCG tablet TAKE 1 TABLET BY MOUTH EVERY DAY 84 tablet 3   methocarbamol (ROBAXIN) 500 MG tablet Take 500 mg by mouth 2 (two) times daily.     Multiple Vitamins-Minerals (EMERGEN-C IMMUNE PO) Take 1 tablet by mouth daily.     Multiple Vitamins-Minerals (ZINC PO) Take 1 tablet by mouth daily.     ondansetron  (ZOFRAN ) 4 MG tablet Take 4 mg by mouth every 8 (eight) hours as needed for nausea or vomiting.     pantoprazole (PROTONIX) 20 MG tablet Take 20 mg by mouth daily.     sertraline  (ZOLOFT ) 50 MG tablet TAKE 1 TABLET BY MOUTH EVERY DAY 90 tablet 1   Vitamin D , Ergocalciferol , (DRISDOL ) 1.25 MG (50000 UNIT) CAPS capsule TAKE 1 CAPSULE (50,000 UNITS TOTAL) BY MOUTH EVERY 7 (SEVEN) DAYS 12 capsule 27   ibuprofen  (ADVIL ,MOTRIN ) 800 MG tablet Take 1 tablet (800 mg total) by mouth every 8 (eight) hours as needed. (Patient  not taking: Reported on 07/29/2024) 30 tablet 0   nortriptyline  (PAMELOR ) 10 MG capsule TAKE 3 CAPSULES BY MOUTH EVERY DAY AT BEDTIME 270 capsule 2   rizatriptan  (MAXALT ) 10 MG tablet Take 1 tablet at the onset of migraine. Can repeat in 2 hours if needed. 2 tabs/24hours (Patient not taking: Reported on 07/29/2024) 10 tablet 3  [2]  Allergies Allergen Reactions   Amoxicillin Rash

## 2024-08-04 NOTE — Op Note (Signed)
 Operative Note   Name: Jon LOISE Crocker  Date of Service: 08/04/2024  DOB: 1966-06-05  MRN: [id number]   PRE-OP DIAGNOSIS:  1) postmenopausal bleeding 2) endometrial mass   POST-OP DIAGNOSIS:  1) postmenopausal bleeding 2) endometrial mass   SURGEON: Surgeons and Role:    DEWAINE Leonce Garnette JONETTA, MD - Primary  PROCEDURE: Procedures: DILATATION AND CURETTAGE /HYSTEROSCOPY POLYPECTOMY, ENDOMETRIAL   ANESTHESIA: General, LMA  ESTIMATED BLOOD LOSS: 50 mL  DRAINS: none   TOTAL IV FLUIDS: 800 mL  SPECIMENS:  Endometrial curettings and polypoid lesions  VTE PROPHYLAXIS: SCDs to the bilateral lower extremities  ANTIBIOTICS: none indicated  FLUID DEFICIT: 195 mL  COMPLICATIONS: None  DISPOSITION: PACU - hemodynamically stable.  CONDITION: stable  INDICATION: 59 y.o. female with postmenopausal bleeding and thickened endometrial lining with suspected polypoid lesion(s). To OR for diagnosis and removal.  FINDINGS: Exam under anesthesia revealed small, mobile anteverted uterus with no masses and bilateral adnexa without masses or fullness. Hysteroscopy revealed a polypoid lesion with narrow base starting on left lower uterine segment and extending into the cervix.  Small left anterior polypoid lesion closer to fundus of uterus.  Several pock mark lesions near right tubal ostium. Otherwise, normal appearing cavity. Cervix appeared normal.   PROCEDURE IN DETAIL:  After informed consent was obtained, the patient was taken to the operating room where anesthesia was obtained without difficulty. The patient was positioned in the dorsal supine lithotomy position in Gibbs stirrups.  The patient's bladder was catheterized with an in and out catheter.  The patient was examined under anesthesia, with the above noted findings.  The bi-valved speculum was placed inside the patient's vagina, and the the anterior lip of the cervix was grasped with the tenaculum.  Then the cervix was progressively  dilated to a 7 mm Hegar dilator.  The hysteroscope was introduced, with the above noted findings. The MyoSure reach device was utilized to remove all polypoid lesions and to sample any irregularities of the endometrium.  The hystersocope was removed and the uterine cavity was curetted until a gritty texture was noted, yielding small endometrial curettings.  The hysteroscope was reintroduced and the intracavitary pressure was lowered gradually to a level of that well below the MAP.  Hemostasis was noted, and all instruments were removed.  The cervix was observed for 5 minutes to ensure no active bleeding.  The tenaculum was removed and silver  nitrate was used to obtain hemostasis at the tenaculum entry sites.  The speculum was removed after verifying that no sponges or instruments remained.  She was then taken out of dorsal lithotomy.  The patient tolerated the procedure well.  Sponge, lap and needle counts were correct x2.  The patient was taken to recovery room in excellent condition.  Garnette CHARM Leonce, MD, FACOG 08/04/2024 12:40 PM

## 2024-08-04 NOTE — Anesthesia Postprocedure Evaluation (Signed)
"   Anesthesia Post Note  Patient: Angela Wu  Procedure(s) Performed: DILATATION AND CURETTAGE /HYSTEROSCOPY (Cervix) POLYPECTOMY, ENDOMETRIAL (Cervix)  Patient location during evaluation: PACU Anesthesia Type: General Level of consciousness: awake and alert Pain management: pain level controlled Vital Signs Assessment: post-procedure vital signs reviewed and stable Respiratory status: spontaneous breathing, nonlabored ventilation, respiratory function stable and patient connected to nasal cannula oxygen Cardiovascular status: blood pressure returned to baseline and stable Postop Assessment: no apparent nausea or vomiting Anesthetic complications: no   No notable events documented.   Last Vitals:  Vitals:   08/04/24 0952 08/04/24 1245  BP: 128/79 125/70  Pulse: 87 72  Resp: 16 (!) 22  Temp: (!) 36.1 C (!) 36.2 C  SpO2: 97% 98%    Last Pain:  Vitals:   08/04/24 1245  PainSc: 0-No pain                 [name]      "

## 2024-08-05 ENCOUNTER — Encounter: Payer: Self-pay | Admitting: Obstetrics and Gynecology

## 2024-08-05 ENCOUNTER — Encounter: Admission: RE | Disposition: A | Payer: Self-pay | Source: Home / Self Care | Attending: Obstetrics and Gynecology

## 2024-08-05 ENCOUNTER — Encounter: Payer: Self-pay | Admitting: Urgent Care

## 2024-08-05 LAB — SURGICAL PATHOLOGY
